# Patient Record
Sex: Female | Born: 1970
Health system: Southern US, Community
[De-identification: ages and names within clinical notes are randomized; demographics above are authoritative.]

## PROBLEM LIST (undated history)

## (undated) DIAGNOSIS — E119 Type 2 diabetes mellitus without complications: Secondary | ICD-10-CM

## (undated) DIAGNOSIS — J45909 Unspecified asthma, uncomplicated: Secondary | ICD-10-CM

## (undated) DIAGNOSIS — D649 Anemia, unspecified: Secondary | ICD-10-CM

## (undated) DIAGNOSIS — G4700 Insomnia, unspecified: Secondary | ICD-10-CM

## (undated) DIAGNOSIS — I341 Nonrheumatic mitral (valve) prolapse: Secondary | ICD-10-CM

## (undated) DIAGNOSIS — G500 Trigeminal neuralgia: Secondary | ICD-10-CM

## (undated) DIAGNOSIS — I10 Essential (primary) hypertension: Secondary | ICD-10-CM

## (undated) DIAGNOSIS — F419 Anxiety disorder, unspecified: Secondary | ICD-10-CM

## (undated) HISTORY — PX: TUBAL LIGATION: SHX77

## (undated) HISTORY — DX: Insomnia, unspecified: G47.00

## (undated) HISTORY — DX: Anxiety disorder, unspecified: F41.9

## (undated) HISTORY — PX: APPENDECTOMY: SHX54

## (undated) HISTORY — DX: Nonrheumatic mitral (valve) prolapse: I34.1

## (undated) HISTORY — DX: Anemia, unspecified: D64.9

## (undated) HISTORY — PX: TONSILLECTOMY: SUR1361

## (undated) HISTORY — PX: OTHER SURGICAL HISTORY: SHX169

## (undated) HISTORY — DX: Trigeminal neuralgia: G50.0

## (undated) HISTORY — PX: CHOLECYSTECTOMY: SHX55

---

## 2002-01-26 ENCOUNTER — Encounter: Payer: Self-pay | Admitting: Emergency Medicine

## 2002-01-26 ENCOUNTER — Emergency Department (HOSPITAL_COMMUNITY): Admission: EM | Admit: 2002-01-26 | Discharge: 2002-01-26 | Payer: Self-pay | Admitting: Emergency Medicine

## 2003-07-31 HISTORY — PX: CHOLECYSTECTOMY: SHX55

## 2013-07-16 ENCOUNTER — Encounter (HOSPITAL_COMMUNITY): Payer: Self-pay | Admitting: Emergency Medicine

## 2013-07-16 ENCOUNTER — Emergency Department (HOSPITAL_COMMUNITY)
Admission: EM | Admit: 2013-07-16 | Discharge: 2013-07-16 | Disposition: A | Discharge status: Home / Self Care | Payer: BC Managed Care – PPO | Attending: Emergency Medicine | Admitting: Emergency Medicine

## 2013-07-16 ENCOUNTER — Emergency Department (HOSPITAL_COMMUNITY): Payer: BC Managed Care – PPO

## 2013-07-16 DIAGNOSIS — J069 Acute upper respiratory infection, unspecified: Secondary | ICD-10-CM | POA: Insufficient documentation

## 2013-07-16 DIAGNOSIS — J45909 Unspecified asthma, uncomplicated: Secondary | ICD-10-CM | POA: Insufficient documentation

## 2013-07-16 DIAGNOSIS — I10 Essential (primary) hypertension: Secondary | ICD-10-CM | POA: Insufficient documentation

## 2013-07-16 DIAGNOSIS — E119 Type 2 diabetes mellitus without complications: Secondary | ICD-10-CM | POA: Insufficient documentation

## 2013-07-16 HISTORY — DX: Unspecified asthma, uncomplicated: J45.909

## 2013-07-16 HISTORY — DX: Essential (primary) hypertension: I10

## 2013-07-16 HISTORY — DX: Type 2 diabetes mellitus without complications: E11.9

## 2013-07-16 LAB — RAPID STREP SCREEN (MED CTR MEBANE ONLY): Streptococcus, Group A Screen (Direct): NEGATIVE

## 2013-07-16 MED ORDER — IBUPROFEN 800 MG PO TABS
800.0000 mg | ORAL_TABLET | Freq: Once | ORAL | Status: AC
  Administered 2013-07-16: 800 mg via ORAL
  Filled 2013-07-16: qty 1

## 2013-07-16 NOTE — ED Notes (Signed)
Sore throat, fever , onset this am. Daughter has a strep throat.

## 2013-07-16 NOTE — ED Provider Notes (Signed)
CSN: [id number]     Arrival date & time 07/16/13  1747 History   First MD Initiated Contact with Patient 07/16/13 1816     Chief Complaint  Patient presents with  . Sore Throat  . Cough  . Fever   HPI Pt was seen at 1840.  Per pt, c/o gradual onset and persistence of constant sore throat, runny/stuffy nose, sinus congestion, home fever/chills and cough since this morning.  States her daughter and son have the same symptoms; daughter was dx with "strep throat" and pt is concerned regarding same today. Denies rash, no CP/SOB, no N/V/D, no abd pain.     Past Medical History  Diagnosis Date  . Diabetes mellitus without complication   . Hypertension   . Asthma    Past Surgical History  Procedure Laterality Date  . Appendectomy    . Tonsillectomy    . Cholecystectomy    . Dilitation and curretage    . Tubal ligation      History  Substance Use Topics  . Smoking status: Never Smoker   . Smokeless tobacco: Not on file  . Alcohol Use: No    Review of Systems ROS: Statement: All systems negative except as marked or noted in the HPI; Constitutional: +fever and chills. ; ; Eyes: Negative for eye pain, redness and discharge. ; ; ENMT: Negative for ear pain, hoarseness, +nasal congestion, sinus pressure, rhinorrhea, and sore throat. ; ; Cardiovascular: Negative for chest pain, palpitations, diaphoresis, dyspnea and peripheral edema. ; ; Respiratory: +cough. Negative for wheezing and stridor. ; ; Gastrointestinal: Negative for nausea, vomiting, diarrhea, abdominal pain, blood in stool, hematemesis, jaundice and rectal bleeding. . ; ; Genitourinary: Negative for dysuria, flank pain and hematuria. ; ; Musculoskeletal: Negative for back pain and neck pain. Negative for swelling and trauma.; ; Skin: Negative for pruritus, rash, abrasions, blisters, bruising and skin lesion.; ; Neuro: Negative for headache, lightheadedness and neck stiffness. Negative for weakness, altered level of consciousness ,  altered mental status, extremity weakness, paresthesias, involuntary movement, seizure and syncope.       Allergies  Adhesive and Erythromycin  Home Medications  No current outpatient prescriptions on file. BP 123/89  Pulse 122  Temp(Src) 102.2 F (39 C) (Oral)  Resp 22  Ht 5' 8.5" (1.74 m)  Wt 275 lb (124.739 kg)  BMI 41.20 kg/m2  SpO2 100%  LMP 06/20/2013 Physical Exam 1845: Physical examination:  Nursing notes reviewed; Vital signs and O2 SAT reviewed;  Constitutional: Well developed, Well nourished, Well hydrated, In no acute distress; Head:  Normocephalic, atraumatic; Eyes: EOMI, PERRL, No scleral icterus; ENMT: TM's clear bilat. +edemetous nasal turbinates bilat with clear rhinorrhea. Mouth and pharynx without lesions. No tonsillar exudates. No intra-oral edema. No submandibular or sublingual edema. No hoarse voice, no drooling, no stridor. No pain with manipulation of larynx. Mouth and pharynx normal, Mucous membranes moist; Neck: Supple, Full range of motion, No lymphadenopathy; Cardiovascular: Regular rate and rhythm, No murmur, rub, or gallop; Respiratory: Breath sounds clear & equal bilaterally, No rales, rhonchi, wheezes.  Speaking full sentences with ease, Normal respiratory effort/excursion; Chest: Nontender, Movement normal; Abdomen: Soft, Nontender, Nondistended, Normal bowel sounds; Genitourinary: No CVA tenderness; Extremities: Pulses normal, No tenderness, No edema, No calf edema or asymmetry.; Neuro: AA&Ox3, Major CN grossly intact.  Speech clear. Climbs on and off stretcher easily by herself. Gait steady. No gross focal motor or sensory deficits in extremities.; Skin: Color normal, Warm, Dry.   ED Course  Procedures  EKG Interpretation   None       MDM  MDM Reviewed: previous chart, nursing note and vitals Interpretation: labs and x-ray   Results for orders placed during the hospital encounter of 07/16/13  RAPID STREP SCREEN      Result Value Range    Streptococcus, Group A Screen (Direct) NEGATIVE  NEGATIVE   Dg Chest 2 View 07/16/2013   CLINICAL DATA:  Cough and fever.  EXAM: CHEST  2 VIEW  COMPARISON:  01/26/2013  FINDINGS: The heart size and mediastinal contours are within normal limits. Both lungs are clear. The visualized skeletal structures are unremarkable.  IMPRESSION: No active cardiopulmonary disease.   Electronically Signed   By: Myles Rosenthal M.D.   On: 07/16/2013 19:12    1920:   Appears URI, will tx symptomatically for now. Pt wants to go home. States she already has PMD appt on Monday. Dx and testing d/w pt and family.  Questions answered.  Verb understanding, agreeable to d/c home with outpt f/u.      Laray Anger, DO 07/18/13 1130

## 2013-07-18 LAB — CULTURE, GROUP A STREP

## 2014-07-30 HISTORY — PX: GASTRIC BYPASS: SHX52

## 2015-01-21 IMAGING — CR DG CHEST 2V
2 series · 2 of 2 positions shown · non-contrast
Comparison: 01/26/2013

CLINICAL DATA: Cough and fever.

EXAM:
CHEST  2 VIEW

[view not recorded (1 of 2)]
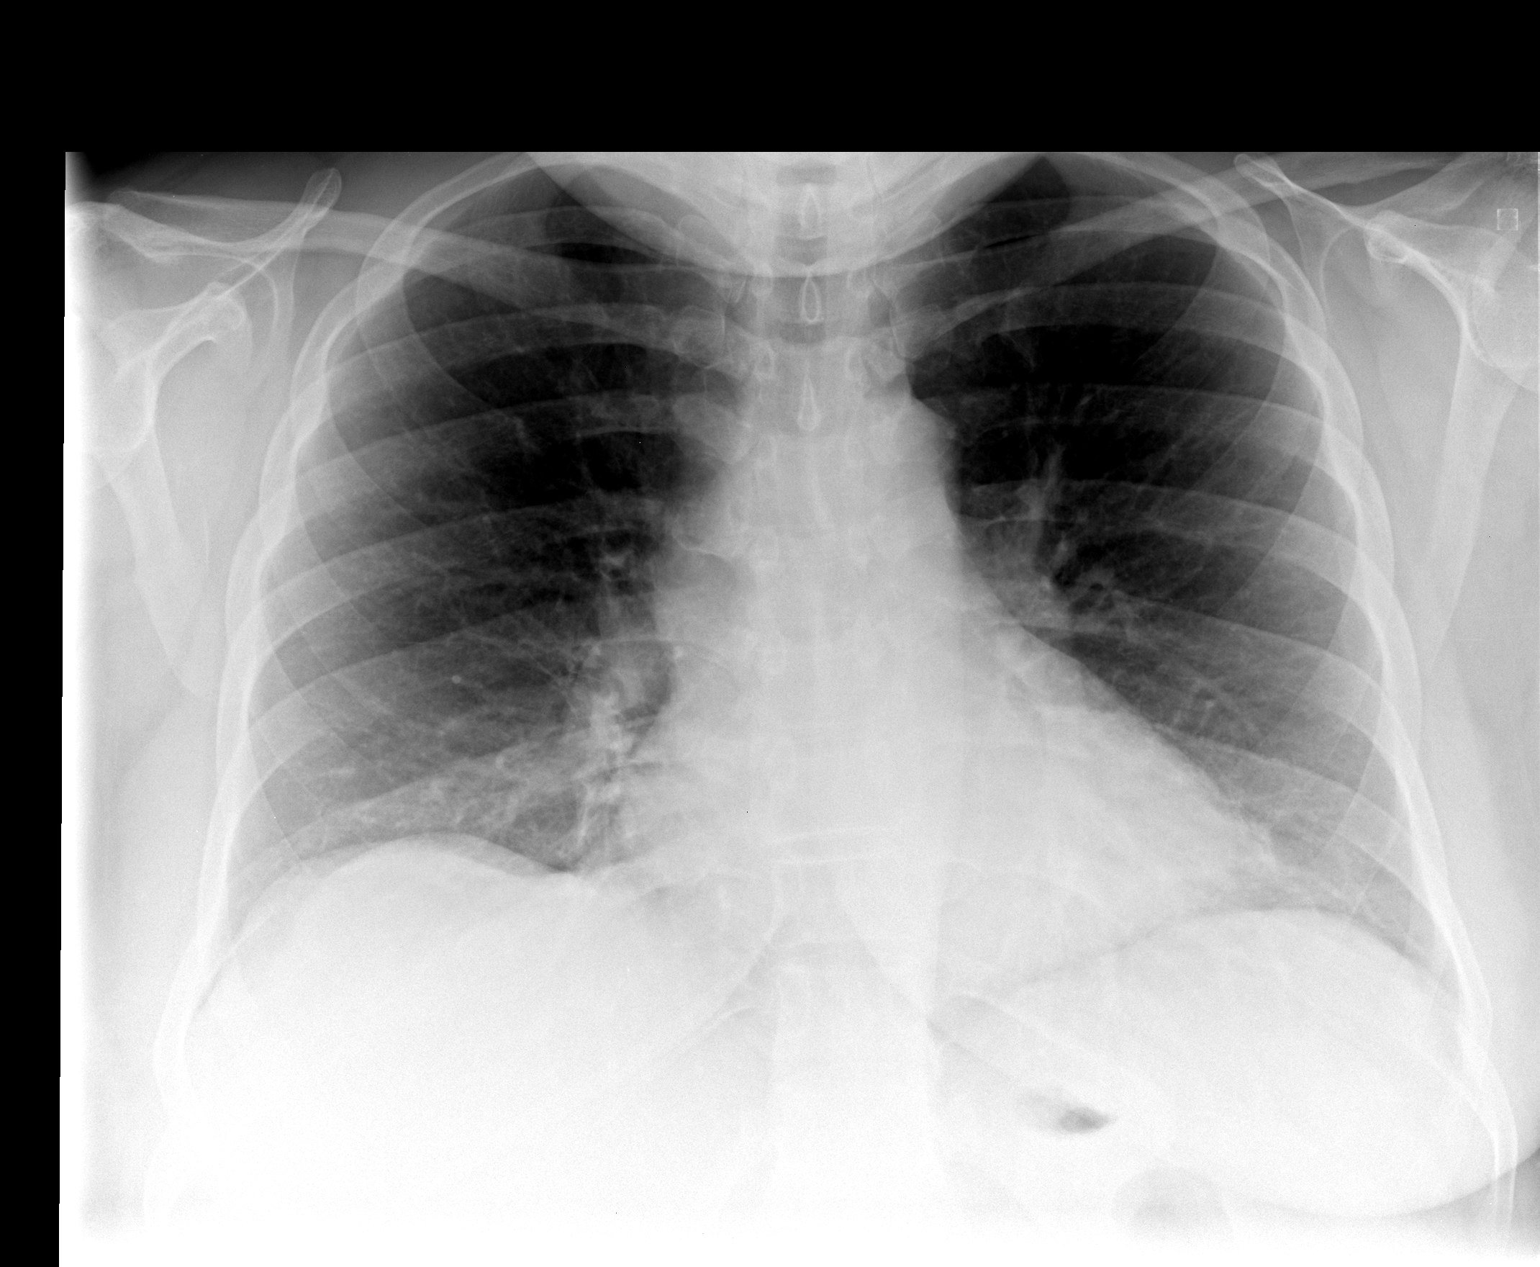

[view not recorded (2 of 2)]
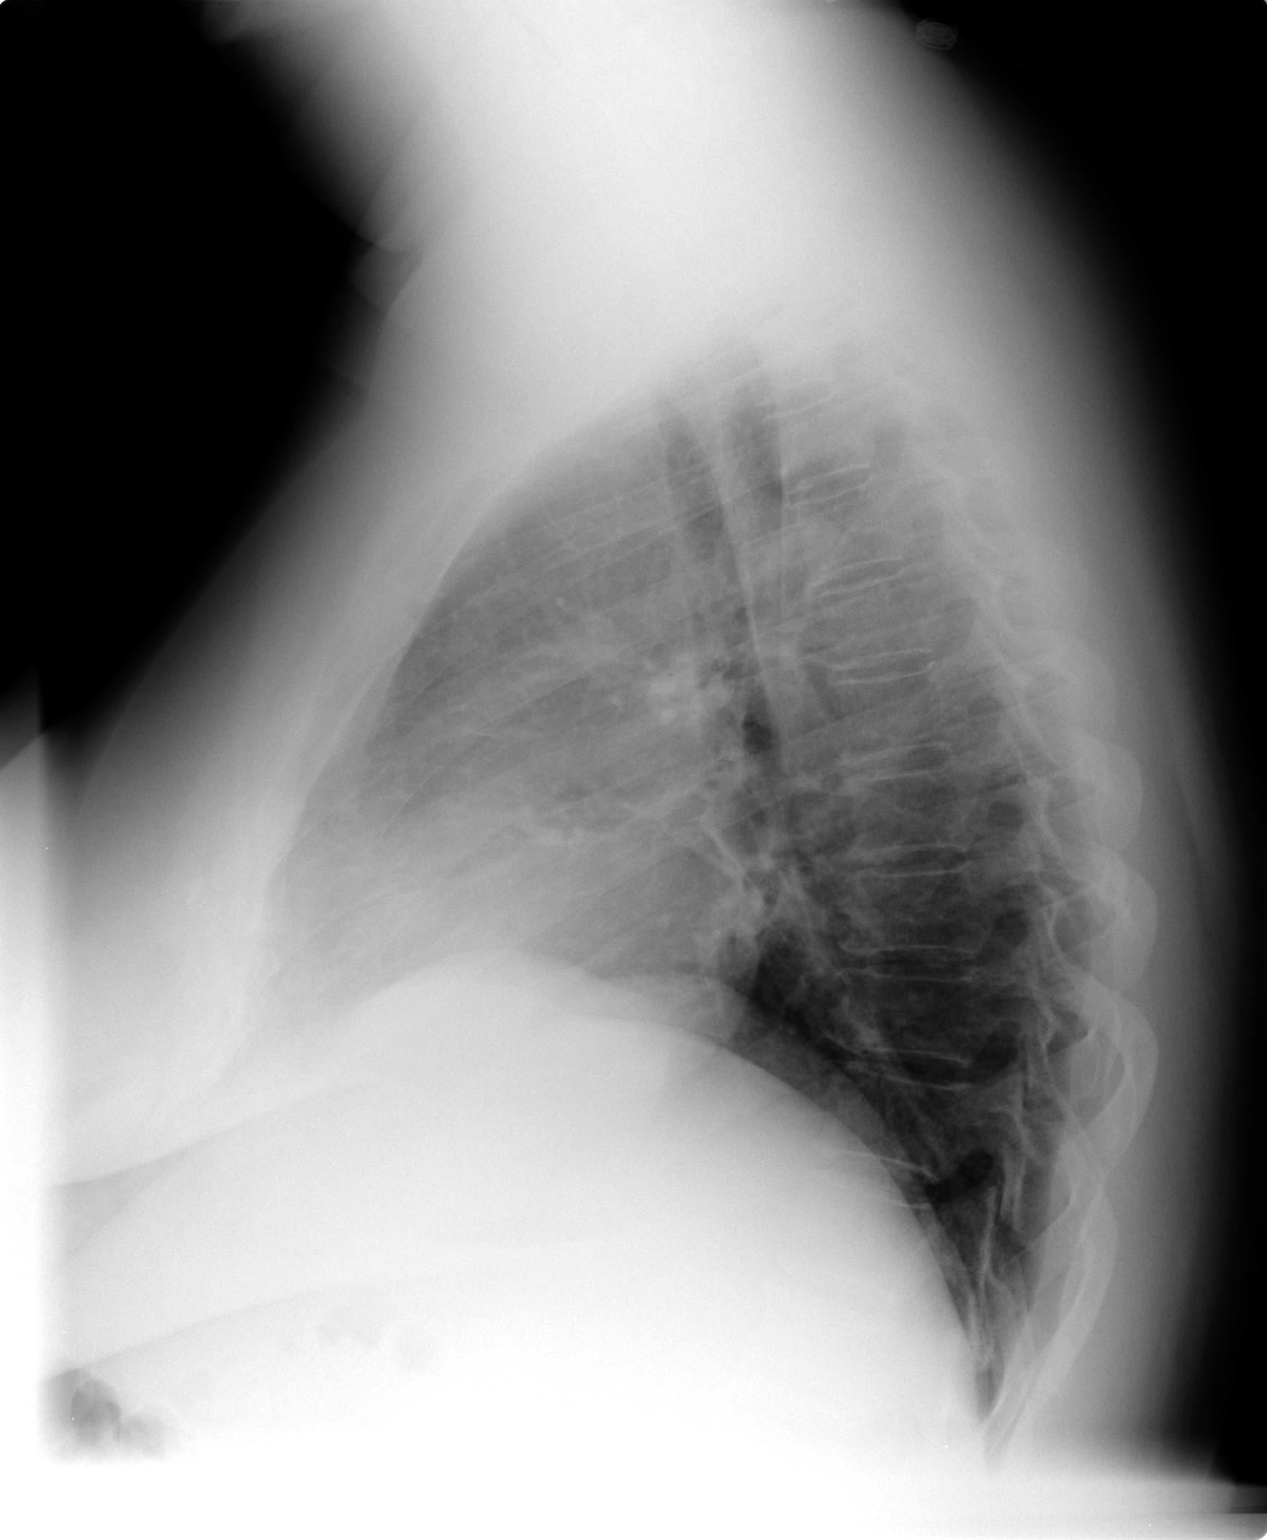

[2 of 2 positions shown; findings below may reference images not displayed]

FINDINGS: The heart size and mediastinal contours are within normal limits.
Both lungs are clear. The visualized skeletal structures are
unremarkable.
IMPRESSION: No active cardiopulmonary disease.

## 2015-02-28 HISTORY — PX: OTHER SURGICAL HISTORY: SHX169

## 2015-11-22 DIAGNOSIS — I8312 Varicose veins of left lower extremity with inflammation: Secondary | ICD-10-CM | POA: Diagnosis not present

## 2015-11-22 DIAGNOSIS — I8311 Varicose veins of right lower extremity with inflammation: Secondary | ICD-10-CM | POA: Diagnosis not present

## 2015-11-25 DIAGNOSIS — K210 Gastro-esophageal reflux disease with esophagitis: Secondary | ICD-10-CM | POA: Diagnosis not present

## 2015-11-25 DIAGNOSIS — I10 Essential (primary) hypertension: Secondary | ICD-10-CM | POA: Diagnosis not present

## 2015-11-25 DIAGNOSIS — Z6829 Body mass index (BMI) 29.0-29.9, adult: Secondary | ICD-10-CM | POA: Diagnosis not present

## 2015-11-25 DIAGNOSIS — E1165 Type 2 diabetes mellitus with hyperglycemia: Secondary | ICD-10-CM | POA: Diagnosis not present

## 2015-12-07 DIAGNOSIS — I83811 Varicose veins of right lower extremities with pain: Secondary | ICD-10-CM | POA: Diagnosis not present

## 2015-12-07 DIAGNOSIS — I8311 Varicose veins of right lower extremity with inflammation: Secondary | ICD-10-CM | POA: Diagnosis not present

## 2015-12-09 DIAGNOSIS — I8311 Varicose veins of right lower extremity with inflammation: Secondary | ICD-10-CM | POA: Diagnosis not present

## 2015-12-12 DIAGNOSIS — I8311 Varicose veins of right lower extremity with inflammation: Secondary | ICD-10-CM | POA: Diagnosis not present

## 2015-12-12 DIAGNOSIS — M79651 Pain in right thigh: Secondary | ICD-10-CM | POA: Diagnosis not present

## 2015-12-21 DIAGNOSIS — I83811 Varicose veins of right lower extremities with pain: Secondary | ICD-10-CM | POA: Diagnosis not present

## 2015-12-21 DIAGNOSIS — I87321 Chronic venous hypertension (idiopathic) with inflammation of right lower extremity: Secondary | ICD-10-CM | POA: Diagnosis not present

## 2015-12-21 DIAGNOSIS — I8311 Varicose veins of right lower extremity with inflammation: Secondary | ICD-10-CM | POA: Diagnosis not present

## 2016-01-04 DIAGNOSIS — I8311 Varicose veins of right lower extremity with inflammation: Secondary | ICD-10-CM | POA: Diagnosis not present

## 2016-01-04 DIAGNOSIS — I83811 Varicose veins of right lower extremities with pain: Secondary | ICD-10-CM | POA: Diagnosis not present

## 2016-01-04 DIAGNOSIS — M7981 Nontraumatic hematoma of soft tissue: Secondary | ICD-10-CM | POA: Diagnosis not present

## 2016-01-04 DIAGNOSIS — I87321 Chronic venous hypertension (idiopathic) with inflammation of right lower extremity: Secondary | ICD-10-CM | POA: Diagnosis not present

## 2016-01-24 DIAGNOSIS — I83811 Varicose veins of right lower extremities with pain: Secondary | ICD-10-CM | POA: Diagnosis not present

## 2016-01-24 DIAGNOSIS — I8311 Varicose veins of right lower extremity with inflammation: Secondary | ICD-10-CM | POA: Diagnosis not present

## 2016-01-24 DIAGNOSIS — M7981 Nontraumatic hematoma of soft tissue: Secondary | ICD-10-CM | POA: Diagnosis not present

## 2016-01-24 DIAGNOSIS — I87321 Chronic venous hypertension (idiopathic) with inflammation of right lower extremity: Secondary | ICD-10-CM | POA: Diagnosis not present

## 2016-03-26 DIAGNOSIS — K210 Gastro-esophageal reflux disease with esophagitis: Secondary | ICD-10-CM | POA: Diagnosis not present

## 2016-03-26 DIAGNOSIS — Z79899 Other long term (current) drug therapy: Secondary | ICD-10-CM | POA: Diagnosis not present

## 2016-03-26 DIAGNOSIS — I10 Essential (primary) hypertension: Secondary | ICD-10-CM | POA: Diagnosis not present

## 2016-03-26 DIAGNOSIS — E1165 Type 2 diabetes mellitus with hyperglycemia: Secondary | ICD-10-CM | POA: Diagnosis not present

## 2016-03-26 DIAGNOSIS — Z6830 Body mass index (BMI) 30.0-30.9, adult: Secondary | ICD-10-CM | POA: Diagnosis not present

## 2016-04-25 DIAGNOSIS — N390 Urinary tract infection, site not specified: Secondary | ICD-10-CM | POA: Diagnosis not present

## 2016-07-24 DIAGNOSIS — K210 Gastro-esophageal reflux disease with esophagitis: Secondary | ICD-10-CM | POA: Diagnosis not present

## 2016-07-24 DIAGNOSIS — J208 Acute bronchitis due to other specified organisms: Secondary | ICD-10-CM | POA: Diagnosis not present

## 2016-07-24 DIAGNOSIS — I10 Essential (primary) hypertension: Secondary | ICD-10-CM | POA: Diagnosis not present

## 2016-07-24 DIAGNOSIS — E1165 Type 2 diabetes mellitus with hyperglycemia: Secondary | ICD-10-CM | POA: Diagnosis not present

## 2016-10-25 DIAGNOSIS — E1165 Type 2 diabetes mellitus with hyperglycemia: Secondary | ICD-10-CM | POA: Diagnosis not present

## 2016-10-25 DIAGNOSIS — Z6831 Body mass index (BMI) 31.0-31.9, adult: Secondary | ICD-10-CM | POA: Diagnosis not present

## 2016-10-25 DIAGNOSIS — K210 Gastro-esophageal reflux disease with esophagitis: Secondary | ICD-10-CM | POA: Diagnosis not present

## 2016-10-25 DIAGNOSIS — I10 Essential (primary) hypertension: Secondary | ICD-10-CM | POA: Diagnosis not present

## 2017-01-10 DIAGNOSIS — Z1231 Encounter for screening mammogram for malignant neoplasm of breast: Secondary | ICD-10-CM | POA: Diagnosis not present

## 2017-02-21 DIAGNOSIS — E1165 Type 2 diabetes mellitus with hyperglycemia: Secondary | ICD-10-CM | POA: Diagnosis not present

## 2017-02-21 DIAGNOSIS — I10 Essential (primary) hypertension: Secondary | ICD-10-CM | POA: Diagnosis not present

## 2017-02-21 DIAGNOSIS — K210 Gastro-esophageal reflux disease with esophagitis: Secondary | ICD-10-CM | POA: Diagnosis not present

## 2017-02-21 DIAGNOSIS — Z6832 Body mass index (BMI) 32.0-32.9, adult: Secondary | ICD-10-CM | POA: Diagnosis not present

## 2017-03-01 DIAGNOSIS — M4696 Unspecified inflammatory spondylopathy, lumbar region: Secondary | ICD-10-CM | POA: Diagnosis not present

## 2017-03-01 DIAGNOSIS — Z87891 Personal history of nicotine dependence: Secondary | ICD-10-CM | POA: Diagnosis not present

## 2017-03-01 DIAGNOSIS — M47816 Spondylosis without myelopathy or radiculopathy, lumbar region: Secondary | ICD-10-CM | POA: Diagnosis not present

## 2017-03-01 DIAGNOSIS — I10 Essential (primary) hypertension: Secondary | ICD-10-CM | POA: Diagnosis not present

## 2017-03-01 DIAGNOSIS — M5136 Other intervertebral disc degeneration, lumbar region: Secondary | ICD-10-CM | POA: Diagnosis not present

## 2017-03-01 DIAGNOSIS — Z79899 Other long term (current) drug therapy: Secondary | ICD-10-CM | POA: Diagnosis not present

## 2017-03-01 DIAGNOSIS — E119 Type 2 diabetes mellitus without complications: Secondary | ICD-10-CM | POA: Diagnosis not present

## 2017-03-01 DIAGNOSIS — M25552 Pain in left hip: Secondary | ICD-10-CM | POA: Diagnosis not present

## 2017-05-28 DIAGNOSIS — I10 Essential (primary) hypertension: Secondary | ICD-10-CM | POA: Diagnosis not present

## 2017-05-28 DIAGNOSIS — K210 Gastro-esophageal reflux disease with esophagitis: Secondary | ICD-10-CM | POA: Diagnosis not present

## 2017-05-28 DIAGNOSIS — Z6832 Body mass index (BMI) 32.0-32.9, adult: Secondary | ICD-10-CM | POA: Diagnosis not present

## 2017-05-28 DIAGNOSIS — E119 Type 2 diabetes mellitus without complications: Secondary | ICD-10-CM | POA: Diagnosis not present

## 2017-06-04 DIAGNOSIS — Z6832 Body mass index (BMI) 32.0-32.9, adult: Secondary | ICD-10-CM | POA: Diagnosis not present

## 2017-06-04 DIAGNOSIS — M545 Low back pain: Secondary | ICD-10-CM | POA: Diagnosis not present

## 2017-09-23 DIAGNOSIS — N924 Excessive bleeding in the premenopausal period: Secondary | ICD-10-CM | POA: Diagnosis not present

## 2017-09-23 DIAGNOSIS — M545 Low back pain: Secondary | ICD-10-CM | POA: Diagnosis not present

## 2017-09-23 DIAGNOSIS — Z6832 Body mass index (BMI) 32.0-32.9, adult: Secondary | ICD-10-CM | POA: Diagnosis not present

## 2017-12-25 DIAGNOSIS — N924 Excessive bleeding in the premenopausal period: Secondary | ICD-10-CM | POA: Diagnosis not present

## 2017-12-25 DIAGNOSIS — Z6832 Body mass index (BMI) 32.0-32.9, adult: Secondary | ICD-10-CM | POA: Diagnosis not present

## 2017-12-25 DIAGNOSIS — Z0000 Encounter for general adult medical examination without abnormal findings: Secondary | ICD-10-CM | POA: Diagnosis not present

## 2017-12-25 DIAGNOSIS — Z6831 Body mass index (BMI) 31.0-31.9, adult: Secondary | ICD-10-CM | POA: Diagnosis not present

## 2017-12-25 DIAGNOSIS — M545 Low back pain: Secondary | ICD-10-CM | POA: Diagnosis not present

## 2017-12-30 DIAGNOSIS — M47816 Spondylosis without myelopathy or radiculopathy, lumbar region: Secondary | ICD-10-CM | POA: Diagnosis not present

## 2017-12-30 DIAGNOSIS — M545 Low back pain: Secondary | ICD-10-CM | POA: Diagnosis not present

## 2017-12-30 DIAGNOSIS — M4726 Other spondylosis with radiculopathy, lumbar region: Secondary | ICD-10-CM | POA: Diagnosis not present

## 2017-12-30 DIAGNOSIS — M47817 Spondylosis without myelopathy or radiculopathy, lumbosacral region: Secondary | ICD-10-CM | POA: Diagnosis not present

## 2018-01-13 DIAGNOSIS — Z1231 Encounter for screening mammogram for malignant neoplasm of breast: Secondary | ICD-10-CM | POA: Diagnosis not present

## 2018-02-17 DIAGNOSIS — Z01419 Encounter for gynecological examination (general) (routine) without abnormal findings: Secondary | ICD-10-CM | POA: Diagnosis not present

## 2018-03-04 DIAGNOSIS — R87619 Unspecified abnormal cytological findings in specimens from cervix uteri: Secondary | ICD-10-CM | POA: Diagnosis not present

## 2018-03-04 DIAGNOSIS — Z6831 Body mass index (BMI) 31.0-31.9, adult: Secondary | ICD-10-CM | POA: Diagnosis not present

## 2018-03-12 DIAGNOSIS — Z91013 Allergy to seafood: Secondary | ICD-10-CM | POA: Diagnosis not present

## 2018-03-12 DIAGNOSIS — I10 Essential (primary) hypertension: Secondary | ICD-10-CM | POA: Diagnosis not present

## 2018-03-12 DIAGNOSIS — Z91048 Other nonmedicinal substance allergy status: Secondary | ICD-10-CM | POA: Diagnosis not present

## 2018-03-12 DIAGNOSIS — N938 Other specified abnormal uterine and vaginal bleeding: Secondary | ICD-10-CM | POA: Diagnosis not present

## 2018-03-12 DIAGNOSIS — Z881 Allergy status to other antibiotic agents status: Secondary | ICD-10-CM | POA: Diagnosis not present

## 2018-03-12 DIAGNOSIS — N813 Complete uterovaginal prolapse: Secondary | ICD-10-CM | POA: Diagnosis not present

## 2018-03-12 DIAGNOSIS — Z9049 Acquired absence of other specified parts of digestive tract: Secondary | ICD-10-CM | POA: Diagnosis not present

## 2018-03-12 DIAGNOSIS — N920 Excessive and frequent menstruation with regular cycle: Secondary | ICD-10-CM | POA: Diagnosis not present

## 2018-03-12 DIAGNOSIS — J45909 Unspecified asthma, uncomplicated: Secondary | ICD-10-CM | POA: Diagnosis not present

## 2018-03-12 DIAGNOSIS — Z8249 Family history of ischemic heart disease and other diseases of the circulatory system: Secondary | ICD-10-CM | POA: Diagnosis not present

## 2018-03-12 DIAGNOSIS — D649 Anemia, unspecified: Secondary | ICD-10-CM | POA: Diagnosis not present

## 2018-03-12 DIAGNOSIS — Z9884 Bariatric surgery status: Secondary | ICD-10-CM | POA: Diagnosis not present

## 2018-03-12 DIAGNOSIS — N814 Uterovaginal prolapse, unspecified: Secondary | ICD-10-CM | POA: Diagnosis not present

## 2018-03-12 DIAGNOSIS — Z79899 Other long term (current) drug therapy: Secondary | ICD-10-CM | POA: Diagnosis not present

## 2018-03-12 DIAGNOSIS — Z801 Family history of malignant neoplasm of trachea, bronchus and lung: Secondary | ICD-10-CM | POA: Diagnosis not present

## 2018-03-12 DIAGNOSIS — Z6831 Body mass index (BMI) 31.0-31.9, adult: Secondary | ICD-10-CM | POA: Diagnosis not present

## 2018-03-12 DIAGNOSIS — E119 Type 2 diabetes mellitus without complications: Secondary | ICD-10-CM | POA: Diagnosis not present

## 2018-03-12 DIAGNOSIS — Z836 Family history of other diseases of the respiratory system: Secondary | ICD-10-CM | POA: Diagnosis not present

## 2018-03-13 DIAGNOSIS — N939 Abnormal uterine and vaginal bleeding, unspecified: Secondary | ICD-10-CM | POA: Diagnosis not present

## 2018-03-13 DIAGNOSIS — Z9884 Bariatric surgery status: Secondary | ICD-10-CM | POA: Diagnosis not present

## 2018-03-13 DIAGNOSIS — Z836 Family history of other diseases of the respiratory system: Secondary | ICD-10-CM | POA: Diagnosis not present

## 2018-03-13 DIAGNOSIS — N938 Other specified abnormal uterine and vaginal bleeding: Secondary | ICD-10-CM | POA: Diagnosis not present

## 2018-03-13 DIAGNOSIS — E119 Type 2 diabetes mellitus without complications: Secondary | ICD-10-CM | POA: Diagnosis not present

## 2018-03-13 DIAGNOSIS — Z881 Allergy status to other antibiotic agents status: Secondary | ICD-10-CM | POA: Diagnosis not present

## 2018-03-13 DIAGNOSIS — D649 Anemia, unspecified: Secondary | ICD-10-CM | POA: Diagnosis not present

## 2018-03-13 DIAGNOSIS — Z801 Family history of malignant neoplasm of trachea, bronchus and lung: Secondary | ICD-10-CM | POA: Diagnosis not present

## 2018-03-13 DIAGNOSIS — Z79899 Other long term (current) drug therapy: Secondary | ICD-10-CM | POA: Diagnosis not present

## 2018-03-13 DIAGNOSIS — J45909 Unspecified asthma, uncomplicated: Secondary | ICD-10-CM | POA: Diagnosis not present

## 2018-03-13 DIAGNOSIS — N72 Inflammatory disease of cervix uteri: Secondary | ICD-10-CM | POA: Diagnosis not present

## 2018-03-13 DIAGNOSIS — Z9049 Acquired absence of other specified parts of digestive tract: Secondary | ICD-10-CM | POA: Diagnosis not present

## 2018-03-13 DIAGNOSIS — N813 Complete uterovaginal prolapse: Secondary | ICD-10-CM | POA: Diagnosis not present

## 2018-03-13 DIAGNOSIS — E669 Obesity, unspecified: Secondary | ICD-10-CM | POA: Diagnosis not present

## 2018-03-13 DIAGNOSIS — Z91013 Allergy to seafood: Secondary | ICD-10-CM | POA: Diagnosis not present

## 2018-03-13 DIAGNOSIS — I10 Essential (primary) hypertension: Secondary | ICD-10-CM | POA: Diagnosis not present

## 2018-03-13 DIAGNOSIS — Z8249 Family history of ischemic heart disease and other diseases of the circulatory system: Secondary | ICD-10-CM | POA: Diagnosis not present

## 2018-03-13 DIAGNOSIS — Z91048 Other nonmedicinal substance allergy status: Secondary | ICD-10-CM | POA: Diagnosis not present

## 2018-03-13 DIAGNOSIS — N920 Excessive and frequent menstruation with regular cycle: Secondary | ICD-10-CM | POA: Diagnosis not present

## 2018-03-14 DIAGNOSIS — Z836 Family history of other diseases of the respiratory system: Secondary | ICD-10-CM | POA: Diagnosis not present

## 2018-03-14 DIAGNOSIS — N938 Other specified abnormal uterine and vaginal bleeding: Secondary | ICD-10-CM | POA: Diagnosis not present

## 2018-03-14 DIAGNOSIS — Z91048 Other nonmedicinal substance allergy status: Secondary | ICD-10-CM | POA: Diagnosis not present

## 2018-03-14 DIAGNOSIS — E119 Type 2 diabetes mellitus without complications: Secondary | ICD-10-CM | POA: Diagnosis not present

## 2018-03-14 DIAGNOSIS — Z91013 Allergy to seafood: Secondary | ICD-10-CM | POA: Diagnosis not present

## 2018-03-14 DIAGNOSIS — D649 Anemia, unspecified: Secondary | ICD-10-CM | POA: Diagnosis not present

## 2018-03-14 DIAGNOSIS — Z9884 Bariatric surgery status: Secondary | ICD-10-CM | POA: Diagnosis not present

## 2018-03-14 DIAGNOSIS — N920 Excessive and frequent menstruation with regular cycle: Secondary | ICD-10-CM | POA: Diagnosis not present

## 2018-03-14 DIAGNOSIS — Z79899 Other long term (current) drug therapy: Secondary | ICD-10-CM | POA: Diagnosis not present

## 2018-03-14 DIAGNOSIS — N813 Complete uterovaginal prolapse: Secondary | ICD-10-CM | POA: Diagnosis not present

## 2018-03-14 DIAGNOSIS — J45909 Unspecified asthma, uncomplicated: Secondary | ICD-10-CM | POA: Diagnosis not present

## 2018-03-14 DIAGNOSIS — Z881 Allergy status to other antibiotic agents status: Secondary | ICD-10-CM | POA: Diagnosis not present

## 2018-03-14 DIAGNOSIS — Z8249 Family history of ischemic heart disease and other diseases of the circulatory system: Secondary | ICD-10-CM | POA: Diagnosis not present

## 2018-03-14 DIAGNOSIS — Z801 Family history of malignant neoplasm of trachea, bronchus and lung: Secondary | ICD-10-CM | POA: Diagnosis not present

## 2018-03-14 DIAGNOSIS — I10 Essential (primary) hypertension: Secondary | ICD-10-CM | POA: Diagnosis not present

## 2018-03-14 DIAGNOSIS — Z9049 Acquired absence of other specified parts of digestive tract: Secondary | ICD-10-CM | POA: Diagnosis not present

## 2018-03-27 DIAGNOSIS — J4520 Mild intermittent asthma, uncomplicated: Secondary | ICD-10-CM | POA: Diagnosis not present

## 2018-03-27 DIAGNOSIS — F3289 Other specified depressive episodes: Secondary | ICD-10-CM | POA: Diagnosis not present

## 2018-03-27 DIAGNOSIS — Z6831 Body mass index (BMI) 31.0-31.9, adult: Secondary | ICD-10-CM | POA: Diagnosis not present

## 2018-03-27 DIAGNOSIS — I10 Essential (primary) hypertension: Secondary | ICD-10-CM | POA: Diagnosis not present

## 2018-04-09 DIAGNOSIS — Z6831 Body mass index (BMI) 31.0-31.9, adult: Secondary | ICD-10-CM | POA: Diagnosis not present

## 2018-04-09 DIAGNOSIS — J018 Other acute sinusitis: Secondary | ICD-10-CM | POA: Diagnosis not present

## 2018-07-28 DIAGNOSIS — Z6832 Body mass index (BMI) 32.0-32.9, adult: Secondary | ICD-10-CM | POA: Diagnosis not present

## 2018-07-28 DIAGNOSIS — I10 Essential (primary) hypertension: Secondary | ICD-10-CM | POA: Diagnosis not present

## 2018-07-28 DIAGNOSIS — M545 Low back pain: Secondary | ICD-10-CM | POA: Diagnosis not present

## 2018-10-27 DIAGNOSIS — I10 Essential (primary) hypertension: Secondary | ICD-10-CM | POA: Diagnosis not present

## 2018-10-27 DIAGNOSIS — Z6832 Body mass index (BMI) 32.0-32.9, adult: Secondary | ICD-10-CM | POA: Diagnosis not present

## 2018-10-27 DIAGNOSIS — Z79899 Other long term (current) drug therapy: Secondary | ICD-10-CM | POA: Diagnosis not present

## 2018-10-27 DIAGNOSIS — M545 Low back pain: Secondary | ICD-10-CM | POA: Diagnosis not present

## 2018-10-27 DIAGNOSIS — R5383 Other fatigue: Secondary | ICD-10-CM | POA: Diagnosis not present

## 2019-03-17 DIAGNOSIS — Z6833 Body mass index (BMI) 33.0-33.9, adult: Secondary | ICD-10-CM | POA: Diagnosis not present

## 2019-03-17 DIAGNOSIS — Z0000 Encounter for general adult medical examination without abnormal findings: Secondary | ICD-10-CM | POA: Diagnosis not present

## 2019-04-02 ENCOUNTER — Ambulatory Visit: Payer: Self-pay

## 2019-04-02 ENCOUNTER — Other Ambulatory Visit: Payer: Self-pay

## 2019-04-02 ENCOUNTER — Ambulatory Visit (INDEPENDENT_AMBULATORY_CARE_PROVIDER_SITE_OTHER): Payer: BC Managed Care – PPO | Admitting: Orthopaedic Surgery

## 2019-04-02 VITALS — BP 130/86 | HR 76 | Ht 68.0 in | Wt 215.0 lb

## 2019-04-02 DIAGNOSIS — M25562 Pain in left knee: Secondary | ICD-10-CM | POA: Diagnosis not present

## 2019-04-02 DIAGNOSIS — M659 Synovitis and tenosynovitis, unspecified: Secondary | ICD-10-CM | POA: Diagnosis not present

## 2019-04-02 NOTE — Progress Notes (Signed)
Office Visit Note   Patient: Karina Prince           Date of Birth: 09-04-1970           MRN: [id number] Visit Date: 04/02/2019              Requested by: Toma DeitersHasanaj, Xaje A, MD [address] [place],  [place] [id number] PCP: Toma DeitersHasanaj, Xaje A, MD   Assessment & Plan: Visit Diagnoses:  1. Left knee pain, unspecified chronicity     Plan: Left knee injection performed which she tolerated well.  We will see how she does with this and recheck her in a couple weeks.  If she is having persistent symptoms will consider diagnostic MRI imaging of her left knee.  Follow-Up Instructions: No follow-ups on file.   Orders:  Orders Placed This Encounter  Procedures  . XR Knee 1-2 Views Left   No orders of the defined types were placed in this encounter.     Procedures: Large Joint Inj: L knee on 04/02/2019 3:34 PM Indications: joint swelling and pain Details: 22 G 1.5 in needle, anterolateral approach  Arthrogram: No  Medications: 0.5 mL lidocaine 1 %; 3 mL bupivacaine 0.5 %; 40 mg methylPREDNISolone acetate 40 MG/ML Outcome: tolerated well, no immediate complications Procedure, treatment alternatives, risks and benefits explained, specific risks discussed. Consent was given by the patient. Immediately prior to procedure a time out was called to verify the correct patient, procedure, equipment, support staff and site/side marked as required. Patient was prepped and draped in the usual sterile fashion.       Clinical Data: No additional findings.   Subjective: Chief Complaint  Patient presents with  . Left Knee - Pain    HPI 48 year old female with pain in her left knee since July when she fell in late July.  She states she has had difficulty putting any pressure on her left knee and that it locks up and gives way.  She had a steroid Dosepak which did seem to help temporarily.  No history of gout or previous knee fracture.  She denies chills or fever. Review of Systems positive  for previous tonsillectomy appendectomy gallbladder surgery D&C x5.  Gastric bypass 2016 she has been careful to anti-inflammatories.  Possible acid reflux anxiety depression diet-controlled diabetes hypertension superficial blood clot problems in her leg bronchitis asthma trigeminal neuralgia otherwise negative as it pertains HPI.   Objective: Vital Signs: BP 130/86   Pulse 76   Ht 5\' 8"  (1.727 m)   Wt 215 lb (97.5 kg)   BMI 32.69 kg/m   Physical Exam Constitutional:      Appearance: She is well-developed.  HENT:     Head: Normocephalic.     Right Ear: External ear normal.     Left Ear: External ear normal.  Eyes:     Pupils: Pupils are equal, round, and reactive to light.  Neck:     Thyroid: No thyromegaly.     Trachea: No tracheal deviation.  Cardiovascular:     Rate and Rhythm: Normal rate.  Pulmonary:     Effort: Pulmonary effort is normal.  Abdominal:     Palpations: Abdomen is soft.  Skin:    General: Skin is warm and dry.  Neurological:     Mental Status: She is alert and oriented to person, place, and time.  Psychiatric:        Behavior: Behavior normal.     Ortho Exam patient has negative logroll to  her hips.  She has tenderness on the left knee medially pain with patellofemoral compression normal patellar tracking collateral ligaments are stable.  Tenderness along the Pez bursa but not as severe along the joint line.  Specialty Comments:  No specialty comments available.  Imaging: No results found.   PMFS History: There are no active problems to display for this patient.  Past Medical History:  Diagnosis Date  . Asthma   . Diabetes mellitus without complication   . Hypertension     No family history on file.  Past Surgical History:  Procedure Laterality Date  . APPENDECTOMY    . CHOLECYSTECTOMY    . dilitation and curretage    . TONSILLECTOMY    . TUBAL LIGATION     Social History   Occupational History  . Not on file  Tobacco Use  .  Smoking status: Never Smoker  Substance and Sexual Activity  . Alcohol use: No  . Drug use: No  . Sexual activity: Yes    Birth control/protection: Surgical

## 2019-04-07 ENCOUNTER — Encounter: Payer: Self-pay | Admitting: Orthopaedic Surgery

## 2019-04-07 MED ORDER — LIDOCAINE HCL 1 % IJ SOLN
0.5000 mL | INTRAMUSCULAR | Status: AC | PRN
  Administered 2019-04-02: .5 mL

## 2019-04-07 MED ORDER — BUPIVACAINE HCL 0.5 % IJ SOLN
3.0000 mL | INTRAMUSCULAR | Status: AC | PRN
  Administered 2019-04-02: 3 mL via INTRA_ARTICULAR

## 2019-04-07 MED ORDER — METHYLPREDNISOLONE ACETATE 40 MG/ML IJ SUSP
40.0000 mg | INTRAMUSCULAR | Status: AC | PRN
  Administered 2019-04-02: 40 mg via INTRA_ARTICULAR

## 2019-04-27 DIAGNOSIS — Z1231 Encounter for screening mammogram for malignant neoplasm of breast: Secondary | ICD-10-CM | POA: Diagnosis not present

## 2019-06-04 ENCOUNTER — Encounter: Payer: Self-pay | Admitting: Orthopaedic Surgery

## 2019-06-04 ENCOUNTER — Other Ambulatory Visit: Payer: Self-pay

## 2019-06-04 ENCOUNTER — Ambulatory Visit (INDEPENDENT_AMBULATORY_CARE_PROVIDER_SITE_OTHER): Payer: BC Managed Care – PPO | Admitting: Orthopaedic Surgery

## 2019-06-04 VITALS — BP 122/86 | HR 82 | Ht 68.0 in | Wt 215.0 lb

## 2019-06-04 DIAGNOSIS — M25562 Pain in left knee: Secondary | ICD-10-CM

## 2019-06-04 NOTE — Progress Notes (Signed)
Office Visit Note   Patient: Karina Prince           Date of Birth: 1971/03/18           MRN: [id number] Visit Date: 06/04/2019              Requested by: Toma Deiters, MD [address] [place],  [place] [address] PCP: Toma Deiters, MD   Assessment & Plan: Visit Diagnoses:  1. Left knee pain, unspecified chronicity     Plan: Patient has persistent knee symptoms for greater than 3 months now failed treatment with prednisone Dosepak, intra-articular injection, anti-inflammatories, home therapy exercises, topical cream, Tylenol.  X-rays demonstrate some joint line narrowing and mild osteoarthritic changes without acute changes.  Her pain is medial joint line and occasionally sharp.  Will obtain an MRI scan to rule out medial meniscal tear or chondral flap tear office follow-up after scan for review.  Follow-Up Instructions: No follow-ups on file.   Orders:  Orders Placed This Encounter  Procedures  . MR Knee Left w/o contrast   No orders of the defined types were placed in this encounter.     Procedures: No procedures performed   Clinical Data: No additional findings.   Subjective: Chief Complaint  Patient presents with  . Left Knee - Pain, Follow-up    HPI four 48-year-old female returns with 7-month history of left knee pain which is been persistent primarily along the medial side.  She has difficulty being in 1 position and aches more at the end of the day she has problems crossing her leg it wakes her up at night at times she is used Tylenol and has had an intra-articular injection without relief.  She denies locking she is not fallen.  She is also been treated with steroid Dosepak which may have helped for a day or 2.  Previous gastric bypass.  Diet-controlled diabetes, hypertension, history of superficial DVT.  Review of Systems 14 point systems unchanged from 04/02/2019 other than as mentioned in HPI   Objective: Vital Signs: BP 122/86   Pulse 82   Ht 5'  8" (1.727 m)   Wt 215 lb (97.5 kg)   BMI 32.69 kg/m   Physical Exam Constitutional:      Appearance: She is well-developed.  HENT:     Head: Normocephalic.     Right Ear: External ear normal.     Left Ear: External ear normal.  Eyes:     Pupils: Pupils are equal, round, and reactive to light.  Neck:     Thyroid: No thyromegaly.     Trachea: No tracheal deviation.  Cardiovascular:     Rate and Rhythm: Normal rate.  Pulmonary:     Effort: Pulmonary effort is normal.  Abdominal:     Palpations: Abdomen is soft.  Skin:    General: Skin is warm and dry.  Neurological:     Mental Status: She is alert and oriented to person, place, and time.  Psychiatric:        Behavior: Behavior normal.     Ortho Exam patient has some tenderness of the left greater trochanter.  Knee reaches full extension pain with hyperextension.  She has tenderness over the pes bursa as well as medial joint line.  No palpable Baker's cyst.  Lachman test anterior drawer pivot shift test are negative.  No posterior instability.  No lateral joint line tenderness.  Medial joint line tenderness is persistent with and without distraction.  Normal hip range of motion both right and left but some discomfort with hip range of motion on the left with tenderness over the greater trochanter left side.  Negative straight leg raising 90 degrees.  Knee and ankle jerk are intact.  Specialty Comments:  No specialty comments available.  Imaging: No results found.   PMFS History: There are no active problems to display for this patient.  Past Medical History:  Diagnosis Date  . Asthma   . Diabetes mellitus without complication ([place])   . Hypertension     No family history on file.  Past Surgical History:  Procedure Laterality Date  . APPENDECTOMY    . CHOLECYSTECTOMY    . dilitation and curretage    . TONSILLECTOMY    . TUBAL LIGATION     Social History   Occupational History  . Not on file  Tobacco Use  .  Smoking status: Never Smoker  . Smokeless tobacco: Never Used  Substance and Sexual Activity  . Alcohol use: No  . Drug use: No  . Sexual activity: Yes    Birth control/protection: Surgical

## 2019-06-09 DIAGNOSIS — M25462 Effusion, left knee: Secondary | ICD-10-CM | POA: Diagnosis not present

## 2019-06-09 DIAGNOSIS — M25562 Pain in left knee: Secondary | ICD-10-CM | POA: Diagnosis not present

## 2019-06-11 ENCOUNTER — Encounter: Payer: Self-pay | Admitting: Orthopaedic Surgery

## 2019-06-11 ENCOUNTER — Ambulatory Visit (INDEPENDENT_AMBULATORY_CARE_PROVIDER_SITE_OTHER): Payer: BC Managed Care – PPO | Admitting: Orthopaedic Surgery

## 2019-06-11 DIAGNOSIS — M2242 Chondromalacia patellae, left knee: Secondary | ICD-10-CM | POA: Diagnosis not present

## 2019-06-11 DIAGNOSIS — M94261 Chondromalacia, right knee: Secondary | ICD-10-CM

## 2019-06-11 NOTE — Progress Notes (Signed)
Office Visit Note   Patient: Karina Prince           Date of Birth: 09-14-1970           MRN: [id number] Visit Date: 06/11/2019              Requested by: Neale Burly, MD [place],  [place] [address] PCP: Neale Burly, MD   Assessment & Plan: Visit Diagnoses: left knee chondromalacia   Plan: MRI was reviewed with patient I gave her a copy of the report.  She has tricompartmental chondral wear without exposed bone.  Menisci are intact.  Proximal portion of the PCL show some degeneration but no definite tear.  We discussed recommendations for conservative treatment continue to ice anti-inflammatories gradual weight loss using exercise bike.  Avoiding activities with significant increase patellofemoral loading.  We recommended continue using Voltaren gel intermittently and office follow-up if she has increased symptoms.  Follow-Up Instructions: Return if symptoms worsen or fail to improve.   Orders:  No orders of the defined types were placed in this encounter.  No orders of the defined types were placed in this encounter.     Procedures: No procedures performed   Clinical Data: No additional findings.   Subjective: Chief Complaint  Patient presents with  . Left Knee - Follow-up    MRI Review    HPI patient returns with ongoing problems with her knee.  MRI scan has been obtained of her left knee and is available for review.  She continues to have problems when she kneels.  Pain if she has been sitting with her knee in a flexed position and then tries to get up and walk.  No true locking mild swelling.  She is used Voltaren gel oral anti-inflammatories.  Review of Systems reviewed updated unchanged.   Objective: Vital Signs: Ht 5\' 8"  (1.727 m)   Wt 215 lb (97.5 kg)   BMI 32.69 kg/m   Physical Exam Constitutional:      Appearance: She is well-developed.  HENT:     Head: Normocephalic.     Right Ear: External ear normal.     Left Ear:  External ear normal.  Eyes:     Pupils: Pupils are equal, round, and reactive to light.  Neck:     Thyroid: No thyromegaly.     Trachea: No tracheal deviation.  Cardiovascular:     Rate and Rhythm: Normal rate.  Pulmonary:     Effort: Pulmonary effort is normal.  Abdominal:     Palpations: Abdomen is soft.  Skin:    General: Skin is warm and dry.  Neurological:     Mental Status: She is alert and oriented to person, place, and time.  Psychiatric:        Behavior: Behavior normal.     Ortho Exam patient has mild crepitus with knee range of motion no swelling.  Flexion past 120.  Knee comes into full extension.  Mild discomfort with patellofemoral loading.  Specialty Comments:  No specialty comments available.  Imaging: No results found.   PMFS History: Patient Active Problem List   Diagnosis Date Noted  . Chondromalacia patellae, left knee 06/11/2019   Past Medical History:  Diagnosis Date  . Asthma   . Diabetes mellitus without complication ([place])   . Hypertension     No family history on file.  Past Surgical History:  Procedure Laterality Date  . APPENDECTOMY    . CHOLECYSTECTOMY    .  dilitation and curretage    . TONSILLECTOMY    . TUBAL LIGATION     Social History   Occupational History  . Not on file  Tobacco Use  . Smoking status: Never Smoker  . Smokeless tobacco: Never Used  Substance and Sexual Activity  . Alcohol use: No  . Drug use: No  . Sexual activity: Yes    Birth control/protection: Surgical

## 2019-06-22 DIAGNOSIS — Z6832 Body mass index (BMI) 32.0-32.9, adult: Secondary | ICD-10-CM | POA: Diagnosis not present

## 2019-06-22 DIAGNOSIS — F3342 Major depressive disorder, recurrent, in full remission: Secondary | ICD-10-CM | POA: Diagnosis not present

## 2019-06-22 DIAGNOSIS — I10 Essential (primary) hypertension: Secondary | ICD-10-CM | POA: Diagnosis not present

## 2019-06-22 DIAGNOSIS — G588 Other specified mononeuropathies: Secondary | ICD-10-CM | POA: Diagnosis not present

## 2020-09-26 ENCOUNTER — Encounter: Payer: Self-pay | Admitting: Internal Medicine

## 2020-10-28 ENCOUNTER — Ambulatory Visit: Payer: BC Managed Care – PPO | Admitting: Gastroenterology

## 2020-11-05 NOTE — Progress Notes (Addendum)
Referring Provider:  Toma Deiters, MD  Primary Care Physician:  Toma Deiters, MD Primary Gastroenterologist:  Dr. Jena Gauss  Chief Complaint  Patient presents with  . Consult    TCS never done prior. Mother hx polyps, MGM hx colon cancer    HPI:   Karina Prince is a 50 y.o. female presenting today at the request of Hasanaj, Myra Gianotti, MD for consult colonoscopy.  Recommended office visit due to medications.  Today she reports she is doing well overall. No prior colonoscopy.     Reports bowels fluctuate from constipation to diarrhea. Can skip 3-4 days between BMs. When having diarrhea, she can have 5-6 BMs per day. This seems to be more predominant. Abdominal cramping before BMs that resolves thereafter.  No abdominal pain between bowel movements. No brbpr or melena.  Constipation twice a month. No nocturnal stools.  The symptoms have been present for years.   History of gastric bypass in 2016 and cholecystectomy in 2005.   Chronic history of anemia.  Reports she had trouble with anemia even prior to gastric bypass.  She takes oral iron daily.  Reports her hemoglobin can be as low as 7, but if it is good, it is in the 11 range.  Chronically on iron. Chronic fatigue/low energy.   EGD years ago prior to gastric bypass.  Cannot member exactly where this was.  Was told she had reflux. Can't remember if she had ulcerations.   GERD symptoms if eating sauce late at night. Nothing routine. No nausea or vomiting. No dysphagia.    No unintentional weight loss.   Denies NSAIDs.  Reports having labs completed with PCP a month or so ago.  Was told everything looked good. Request to hold off on colonoscopy for a month or so. She is currently fasting. Also had grandchildren being born in May and June.   Past Medical History:  Diagnosis Date  . Anemia   . Anxiety   . Asthma   . Diabetes mellitus without complication (HCC)   . Hypertension   . Insomnia   . MVP (mitral valve prolapse)   .  Trigeminal neuralgia     Past Surgical History:  Procedure Laterality Date  . APPENDECTOMY    . CHOLECYSTECTOMY  2005  . dilitation and curretage    . GASTRIC BYPASS  07/2014  . TONSILLECTOMY    . TUBAL LIGATION    . VENTRAL HERNIA REPAIR  02/2015    Current Outpatient Medications  Medication Sig Dispense Refill  . BIOTIN PO Take by mouth daily.    . bisoprolol (ZEBETA) 10 MG tablet Take 10 mg by mouth daily.    Marland Kitchen CALCIUM PO Take by mouth daily.    . clonazePAM (KLONOPIN) 1 MG tablet Take 1 mg by mouth daily.    . Cyanocobalamin (B-12 PO) Take by mouth daily.    . ferrous sulfate 325 (65 FE) MG EC tablet Take 325 mg by mouth daily.    Marland Kitchen gabapentin (NEURONTIN) 300 MG capsule Take 300 mg by mouth at bedtime.    . Glucosamine-Chondroitin (MOVE FREE PO) Take by mouth daily.    . Methocarbamol (ROBAXIN PO) Take by mouth daily.    . Multiple Vitamin (MULTIVITAMIN) tablet Take 1 tablet by mouth daily.    . traZODone (DESYREL) 100 MG tablet Take 100 mg by mouth at bedtime.    Marland Kitchen venlafaxine (EFFEXOR) 75 MG tablet Take 75 mg by mouth daily.     No current  facility-administered medications for this visit.    Allergies as of 11/07/2020 - Review Complete 11/07/2020  Allergen Reaction Noted  . Adhesive [tape]  07/16/2013  . Erythromycin  07/16/2013    Family History  Problem Relation Age of Onset  . Colon polyps Mother        67s  . Colon cancer Maternal Grandmother        27s    Social History   Socioeconomic History  . Marital status: Married    Spouse name: Not on file  . Number of children: Not on file  . Years of education: Not on file  . Highest education level: Not on file  Occupational History  . Not on file  Tobacco Use  . Smoking status: Former Games developer  . Smokeless tobacco: Never Used  Substance and Sexual Activity  . Alcohol use: No  . Drug use: No  . Sexual activity: Yes    Birth control/protection: Surgical  Other Topics Concern  . Not on file  Social  History Narrative  . Not on file   Social Determinants of Health   Financial Resource Strain: Not on file  Food Insecurity: Not on file  Transportation Needs: Not on file  Physical Activity: Not on file  Stress: Not on file  Social Connections: Not on file  Intimate Partner Violence: Not on file    Review of Systems: Gen: Denies any fever, chills, cold or flulike symptoms, presyncope, syncope. CV: Denies chest pain.  Admits to occasional palpitations in setting of mitral valve prolapse. Resp: Denies shortness of breath or cough. GI: See HPI GU : Denies urinary burning, urinary frequency, urinary hesitancy, hematuria. MS: Denies joint pain, muscle weakness, cramps, or limitation of movement.  Derm: Denies rash Psych: Admits to anxiety. Heme: See HPI  Physical Exam: BP 129/80   Pulse 79   Temp (!) 97.2 F (36.2 C)   Ht 5\' 8"  (1.727 m)   Wt 199 lb (90.3 kg)   LMP 06/20/2013   BMI 30.26 kg/m  General:   Alert and oriented. Pleasant and cooperative. Well-nourished and well-developed.  Head:  Normocephalic and atraumatic. Eyes:  Without icterus, sclera clear and conjunctiva pink.  Ears:  Normal auditory acuity. Lungs:  Clear to auscultation bilaterally. No wheezes, rales, or rhonchi. No distress.  Heart:  S1, S2 present without murmurs appreciated.  Abdomen:  +BS, soft, non-tender and non-distended. No HSM noted. No guarding or rebound. No masses appreciated.  Rectal:  Deferred  Msk:  Symmetrical without gross deformities. Normal posture. Extremities:  Without edema. Neurologic:  Alert and  oriented x4;  grossly normal neurologically. Skin:  Intact without significant lesions or rashes. Psych: Normal mood and affect.   Assessment: 50 year old female with history of MVP, trigeminal neuralgia, hypertension, insomnia, diabetes (diet-controlled), anxiety, chronic anemia on oral iron, gastric bypass in 2016 presenting today at the request of Dr. 2017 to schedule first ever  screening colonoscopy.  She reports chronic history of alternating constipation and diarrhea, though diarrhea is predominant with up to 5-6 postprandial loose bowel movements with associated abdominal cramping prior that improves thereafter.  Denies BRBPR, melena, unintentional weight loss, or abdominal pain between bowel movements.  Denies nocturnal bowel movements.  No significant upper GI symptoms.  Family history significant for mother with colon polyps in her 22s and maternal grandmother with colon cancer in her 21s.  Suspect loose stools may be secondary to bile salt diarrhea and/or IBS-diarrhea predominant.  We will also check for thyroid abnormalities and  screen for celiac disease. Likely start Bentyl after labs are completed.   Chronic anemia: Reports chronic history of anemia.  No recent labs on file though she reports having recent labs with Dr. Olena Leatherwood and was told everything looked good.  Hemoglobin 8.0 with microcytic indices in 2019.  Per patient, when her hemoglobin is good, it is generally in the 11 range.  She is chronically on oral iron.  Denies overt GI bleeding or NSAID use.  No prior colonoscopy.  Reports an EGD years ago.  She cannot remember exactly where this was performed.  She was told she had reflux.  Not sure about ulcerations.   Etiology of anemia is not clear.  History of gastric bypass with decreased iron absorption likely contributing.  Notably, patient reports anemia predates gastric bypass.  Will request recent labs from Dr. Olena Leatherwood for review.  She needs colonoscopy for colon cancer screening.  Consider adding EGD if anemia is present. Moving forward, she may need referral to hematology for IV iron in the setting of gastric bypass.   Plan: 1.  TSH, TTG IgA, IgA total.  2.  Request recent labs from Dr. Olena Leatherwood.   3.  Patient needs colonoscopy with propofol with Dr. Jena Gauss for colon cancer screening.  Consider adding EGD if evidence of anemia on recent labs completed  with PCP.  Further recommendations once I receive and review labs. - Notably, patient is requesting to hold off on procedures for a couple of months.  - She will need to hold iron for 7 days prior to procedures.  - ASA II  4.  We will likely start Bentyl 10 mg daily and increase up to 3 times daily before meals and at bedtime as needed if TSH and celiac screen are unrevealing.     Ermalinda Memos, PA-C [facility] Gastroenterology 11/07/2020

## 2020-11-07 ENCOUNTER — Encounter: Payer: Self-pay | Admitting: Gastroenterology

## 2020-11-07 ENCOUNTER — Other Ambulatory Visit: Payer: Self-pay

## 2020-11-07 ENCOUNTER — Ambulatory Visit: Payer: BC Managed Care – PPO | Admitting: Gastroenterology

## 2020-11-07 VITALS — BP 129/80 | HR 79 | Temp 97.2°F | Ht 68.0 in | Wt 199.0 lb

## 2020-11-07 DIAGNOSIS — D649 Anemia, unspecified: Secondary | ICD-10-CM

## 2020-11-07 DIAGNOSIS — Z1211 Encounter for screening for malignant neoplasm of colon: Secondary | ICD-10-CM | POA: Diagnosis not present

## 2020-11-07 DIAGNOSIS — R195 Other fecal abnormalities: Secondary | ICD-10-CM

## 2020-11-07 NOTE — Patient Instructions (Signed)
Please have labs completed at [facility].  We are requesting recent blood work from Dr. Olena Leatherwood for review.  Pending labs, we will consider starting you on a medication to help with loose stools.   We will also make a decision on proceeding with colonoscopy or colonoscopy and upper endoscopy once I review your labs from Dr. Olena Leatherwood.   It was great meeting you today!  Ermalinda Memos, PA-C [facility] Gastroenterology

## 2020-11-08 ENCOUNTER — Other Ambulatory Visit: Payer: Self-pay | Admitting: Gastroenterology

## 2020-11-08 DIAGNOSIS — R195 Other fecal abnormalities: Secondary | ICD-10-CM

## 2020-11-08 LAB — TSH: TSH: 1.41 mIU/L

## 2020-11-08 LAB — IGA: Immunoglobulin A: 149 mg/dL (ref 47–310)

## 2020-11-08 LAB — TISSUE TRANSGLUTAMINASE, IGA: (tTG) Ab, IgA: <1 U/mL

## 2020-11-08 MED ORDER — DICYCLOMINE HCL 10 MG PO CAPS: ORAL_CAPSULE | ORAL | 2 refills | Status: AC

## 2020-11-18 ENCOUNTER — Telehealth: Payer: Self-pay | Admitting: Gastroenterology

## 2020-11-18 NOTE — Telephone Encounter (Signed)
Darl Pikes, I have been waiting on labs from Dr. Olena Leatherwood for this patient since her OV on 4/11. Can we request labs again?

## 2020-11-21 ENCOUNTER — Other Ambulatory Visit: Payer: Self-pay

## 2020-11-21 DIAGNOSIS — D649 Anemia, unspecified: Secondary | ICD-10-CM

## 2020-11-21 NOTE — Telephone Encounter (Signed)
Reviewed labs completed 09/19/2020.  BMP: Glucose 101, BUN 10, creatinine 0.68, sodium 139, potassium 4.4, calcium 10.7 (H). Hemoglobin A1c 6.0 Lipid panel: Total cholesterol 183, triglycerides 102, HDL 61, LDL 84  Please let patient know I have received and reviewed her labs completed in February 2022 with primary care.  She did not have blood work to check her hemoglobin.  Recommend we update CBC and iron panel at this time.

## 2020-11-21 NOTE — Telephone Encounter (Signed)
Spoke with pt. Pt was notified that lab results were reviewed. Pt will complete labs at [facility]. Lab orders placed.

## 2020-11-21 NOTE — Telephone Encounter (Signed)
I put something of her's in your box this morning.

## 2020-11-25 LAB — CBC WITH DIFFERENTIAL/PLATELET
Basophils Absolute: 0 10*3/uL (ref 0.0–0.2)
Basos: 0 %
EOS (ABSOLUTE): 0.1 10*3/uL (ref 0.0–0.4)
Eos: 2 %
Hematocrit: 36.1 % (ref 34.0–46.6)
Hemoglobin: 11.3 g/dL (ref 11.1–15.9)
Immature Grans (Abs): 0 10*3/uL (ref 0.0–0.1)
Immature Granulocytes: 0 %
Lymphocytes Absolute: 2.3 10*3/uL (ref 0.7–3.1)
Lymphs: 29 %
MCH: 25.8 pg — ABNORMAL LOW (ref 26.6–33.0)
MCHC: 31.3 g/dL — ABNORMAL LOW (ref 31.5–35.7)
MCV: 82 fL (ref 79–97)
Monocytes Absolute: 0.5 10*3/uL (ref 0.1–0.9)
Monocytes: 6 %
Neutrophils Absolute: 5 10*3/uL (ref 1.4–7.0)
Neutrophils: 63 %
Platelets: 295 10*3/uL (ref 150–450)
RBC: 4.38 x10E6/uL (ref 3.77–5.28)
RDW: 15.6 % — ABNORMAL HIGH (ref 11.7–15.4)
WBC: 8 10*3/uL (ref 3.4–10.8)

## 2020-11-25 LAB — IRON,TIBC AND FERRITIN PANEL
Ferritin: 5 ng/mL — ABNORMAL LOW (ref 15–150)
Iron Saturation: 5 % — CL (ref 15–55)
Iron: 19 ug/dL — ABNORMAL LOW (ref 27–159)
Total Iron Binding Capacity: 372 ug/dL (ref 250–450)
UIBC: 353 ug/dL (ref 131–425)

## 2020-11-30 ENCOUNTER — Other Ambulatory Visit: Payer: Self-pay | Admitting: *Deleted

## 2020-11-30 DIAGNOSIS — D509 Iron deficiency anemia, unspecified: Secondary | ICD-10-CM

## 2020-12-14 ENCOUNTER — Encounter (HOSPITAL_COMMUNITY): Payer: Self-pay

## 2020-12-14 ENCOUNTER — Other Ambulatory Visit: Payer: Self-pay

## 2020-12-15 ENCOUNTER — Inpatient Hospital Stay (HOSPITAL_COMMUNITY): Payer: 59 | Attending: Hematology | Admitting: Hematology

## 2020-12-15 DIAGNOSIS — Z9884 Bariatric surgery status: Secondary | ICD-10-CM | POA: Diagnosis not present

## 2020-12-15 DIAGNOSIS — Z803 Family history of malignant neoplasm of breast: Secondary | ICD-10-CM

## 2020-12-15 DIAGNOSIS — Z801 Family history of malignant neoplasm of trachea, bronchus and lung: Secondary | ICD-10-CM | POA: Diagnosis not present

## 2020-12-15 DIAGNOSIS — D509 Iron deficiency anemia, unspecified: Secondary | ICD-10-CM

## 2020-12-15 DIAGNOSIS — Z87891 Personal history of nicotine dependence: Secondary | ICD-10-CM | POA: Insufficient documentation

## 2020-12-15 DIAGNOSIS — Z8371 Family history of colonic polyps: Secondary | ICD-10-CM | POA: Diagnosis not present

## 2020-12-15 DIAGNOSIS — Z800 Family history of malignant neoplasm of digestive organs: Secondary | ICD-10-CM | POA: Diagnosis not present

## 2020-12-15 NOTE — Progress Notes (Signed)
[facility] [address], [place] [address]   CLINIC:  Medical Oncology/Hematology  Patient Care Team: Toma Deiters, MD as PCP - General (Internal Medicine) Jena Gauss, Gerrit Friends, MD as Consulting Physician (Gastroenterology)  CHIEF COMPLAINTS/PURPOSE OF CONSULTATION:  Evaluation of IDA  HISTORY OF PRESENTING ILLNESS:  Karina Prince 50 y.o. female is here because of an evaluation of her IDA, at the request of [facility].  Today she reports feeling well. She reports decreased energy levels. She is taking vitamin D and vitamin B-12 tablets. She denies cravings for ice or dirt but reports craving for oil-cured black olives for a couple years. She has been taking iron tablets (one 60 mg tablet daily since gastric bypass), but she reports they cause nausea. She reports hot flashes and night sweats which she attributes to menopause. She reports SOB upon exertion and denies blood in stools. She reports erythromycin upsets her stomach, but denies other allergies to medicines.   She had gastric bypass surgery 6 years ago; she reports her issues with anemia began following this procedure. The procedure was successful, and she lot approx.100 lbs. She had a partial hysterectomy 3 years ago due to a prolapsed uterus. 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 She denies family history of anemia, and she has never had a blood transfusion. Her maternal aunt and maternal great-aunt had breast cancer. She has been scheduled to have her first colonoscopy by [facility].   MEDICAL HISTORY:  Past Medical History:  Diagnosis Date  . Anemia   . Anxiety   . Asthma   . Diabetes mellitus without complication (HCC)   . Hypertension   . Insomnia   . MVP (mitral valve prolapse)   . Trigeminal neuralgia     SURGICAL HISTORY: Past Surgical History:  Procedure Laterality Date  . APPENDECTOMY    . CHOLECYSTECTOMY  2005  . dilitation and curretage    . GASTRIC BYPASS  07/2014  .  TONSILLECTOMY    . TUBAL LIGATION    . VENTRAL HERNIA REPAIR  02/2015    SOCIAL HISTORY: Social History   Socioeconomic History  . Marital status: Married    Spouse name: Not on file  . Number of children: 4  . Years of education: Not on file  . Highest education level: Not on file  Occupational History  . Not on file  Tobacco Use  . Smoking status: Former Games developer  . Smokeless tobacco: Never Used  Substance and Sexual Activity  . Alcohol use: No  . Drug use: No  . Sexual activity: Yes    Birth control/protection: Surgical  Other Topics Concern  . Not on file  Social History Narrative  . Not on file   Social Determinants of Health   Financial Resource Strain: Medium Risk  . Difficulty of Paying Living Expenses: Somewhat hard  Food Insecurity: No Food Insecurity  . Worried About Programme researcher, broadcasting/film/video in the Last Year: Never true  . Ran Out of Food in the Last Year: Never true  Transportation Needs: No Transportation Needs  . Lack of Transportation (Medical): No  . Lack of Transportation (Non-Medical): No  Physical Activity: Inactive  . Days of Exercise per Week: 0 days  . Minutes of Exercise per Session: 0 min  Stress: Stress Concern Present  . Feeling of Stress : To some extent  Social Connections: Socially Integrated  . Frequency of Communication with Friends and Family: More than three times a  week  . Frequency of Social Gatherings with Friends and Family: Once a week  . Attends Religious Services: More than 4 times per year  . Active Member of Clubs or Organizations: Yes  . Attends BankerClub or Organization Meetings: More than 4 times per year  . Marital Status: Married  Catering managerntimate Partner Violence: Not At Risk  . Fear of Current or Ex-Partner: No  . Emotionally Abused: No  . Physically Abused: No  . Sexually Abused: No    FAMILY HISTORY: Family History  Problem Relation Age of Onset  . Colon polyps Mother        2840s  . Cataracts Mother   . Osteoarthritis  Mother   . Mitral valve prolapse Mother   . Colon cancer Maternal Grandmother        6370s  . Hypertension Sister   . Hypertension Brother   . Breast cancer Maternal Aunt   . Lung cancer Maternal Grandfather   . Hypertension Son   . Hypertension Daughter   . Rheum arthritis Daughter   . Migraines Daughter     ALLERGIES:  is allergic to adhesive [tape] and erythromycin.  MEDICATIONS:  Current Outpatient Medications  Medication Sig Dispense Refill  . benazepril (LOTENSIN) 5 MG tablet Take 5 mg by mouth daily.    Marland Kitchen. BIOTIN PO Take by mouth daily.    Marland Kitchen. CALCIUM PO Take by mouth daily.    . clonazePAM (KLONOPIN) 1 MG tablet Take 1 mg by mouth daily.    . Cyanocobalamin (B-12 PO) Take by mouth daily.    Marland Kitchen. dicyclomine (BENTYL) 10 MG capsule Take 1 capsule (10 mg total) by mouth once every morning before breakfast. May increase up to 3 times daily before meals and at bedtime as needed for loose stools and abdominal cramping. Hold in the setting of constipation. 90 capsule 2  . ferrous sulfate 325 (65 FE) MG EC tablet Take 325 mg by mouth daily.    Marland Kitchen. gabapentin (NEURONTIN) 300 MG capsule Take 300 mg by mouth at bedtime.    . Glucosamine-Chondroitin (MOVE FREE PO) Take by mouth daily.    . Methocarbamol (ROBAXIN PO) Take by mouth daily. (Patient not taking: Reported on 12/14/2020)    . Multiple Vitamin (MULTIVITAMIN) tablet Take 1 tablet by mouth daily.    . traZODone (DESYREL) 100 MG tablet Take 100 mg by mouth at bedtime.    Marland Kitchen. venlafaxine (EFFEXOR) 75 MG tablet Take 75 mg by mouth daily.     No current facility-administered medications for this visit.    REVIEW OF SYSTEMS:   Review of Systems  Constitutional: Positive for appetite change (75%) and fatigue (50%).  Respiratory: Positive for shortness of breath (w/ exertion).   Gastrointestinal: Positive for constipation and diarrhea. Negative for blood in stool.  Endocrine: Positive for hot flashes.  Psychiatric/Behavioral: Positive for  sleep disturbance.  All other systems reviewed and are negative.    PHYSICAL EXAMINATION: ECOG PERFORMANCE STATUS: 0 - Asymptomatic  Vitals:   12/15/20 0817  BP: 115/66  Pulse: 64  Resp: 18  Temp: (!) 97 F (36.1 C)  SpO2: 98%   Filed Weights   12/15/20 0817  Weight: 197 lb 9.6 oz (89.6 kg)   Physical Exam Vitals reviewed.  Constitutional:      Appearance: Normal appearance.  Cardiovascular:     Rate and Rhythm: Normal rate and regular rhythm.     Pulses: Normal pulses.     Heart sounds: Normal heart sounds.  Pulmonary:  Effort: Pulmonary effort is normal.     Breath sounds: Normal breath sounds.  Abdominal:     Palpations: Abdomen is soft. There is no hepatomegaly, splenomegaly or mass.     Tenderness: There is no abdominal tenderness.  Musculoskeletal:     Right lower leg: No edema.     Left lower leg: No edema.  Neurological:     General: No focal deficit present.     Mental Status: She is alert and oriented to person, place, and time.  Psychiatric:        Mood and Affect: Mood normal.        Behavior: Behavior normal.      LABORATORY DATA:  I have reviewed the data as listed Recent Results (from the past 2160 hour(s))  IgA     Status: None   Collection Time: 11/07/20 10:55 AM  Result Value Ref Range   Immunoglobulin A 149 47 - 310 mg/dL  Tissue transglutaminase, IgA     Status: None   Collection Time: 11/07/20 10:55 AM  Result Value Ref Range   (tTG) Ab, IgA <1.0 U/mL    Comment: Value          Interpretation -----          -------------- <15.0          Antibody not detected > or = 15.0    Antibody detected .   TSH     Status: None   Collection Time: 11/07/20 10:55 AM  Result Value Ref Range   TSH 1.41 mIU/L    Comment:           Reference Range .           > or = 20 Years  0.40-4.50 .                Pregnancy Ranges           First trimester    0.26-2.66           Second trimester   0.55-2.73           Third trimester    0.43-2.91    CBC with Differential     Status: Abnormal   Collection Time: 11/24/20 11:08 AM  Result Value Ref Range   WBC 8.0 3.4 - 10.8 x10E3/uL   RBC 4.38 3.77 - 5.28 x10E6/uL   Hemoglobin 11.3 11.1 - 15.9 g/dL   Hematocrit 51.7 00.1 - 46.6 %   MCV 82 79 - 97 fL   MCH 25.8 (L) 26.6 - 33.0 pg   MCHC 31.3 (L) 31.5 - 35.7 g/dL   RDW 74.9 (H) 44.9 - 67.5 %   Platelets 295 150 - 450 x10E3/uL   Neutrophils 63 Not Estab. %   Lymphs 29 Not Estab. %   Monocytes 6 Not Estab. %   Eos 2 Not Estab. %   Basos 0 Not Estab. %   Neutrophils Absolute 5.0 1.4 - 7.0 x10E3/uL   Lymphocytes Absolute 2.3 0.7 - 3.1 x10E3/uL   Monocytes Absolute 0.5 0.1 - 0.9 x10E3/uL   EOS (ABSOLUTE) 0.1 0.0 - 0.4 x10E3/uL   Basophils Absolute 0.0 0.0 - 0.2 x10E3/uL   Immature Granulocytes 0 Not Estab. %   Immature Grans (Abs) 0.0 0.0 - 0.1 x10E3/uL  Fe+TIBC+Fer     Status: Abnormal   Collection Time: 11/24/20 11:10 AM  Result Value Ref Range   Total Iron Binding Capacity 372 250 - 450 ug/dL   UIBC 916 384 -  425 ug/dL   Iron 19 (L) 27 - 264 ug/dL   Iron Saturation 5 (LL) 15 - 55 %   Ferritin 5 (L) 15 - 150 ng/mL    RADIOGRAPHIC STUDIES: I have personally reviewed the radiological images as listed and agreed with the findings in the report. No results found.  ASSESSMENT:  1.  Iron deficiency anemia: - She had gastric bypass surgery 6 years ago at [facility].  She lost 100 pounds and gained about 7 pounds from her lowest weight. - She has been taking iron tablet daily since gastric bypass surgery.  She also takes sublingual B12 and vitamin D supplements. - She denies any previous blood transfusion.  She had TAH 3 years ago for prolapsed uterus. - She denies any ice pica but has cravings for oral cured black olives and eats them on regular basis.  2.  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 - No family history of anemia or thalassemia.  Maternal aunt had breast cancer.  Maternal  great aunt had breast cancer.  PLAN:  1.  Iron deficiency anemia: - We reviewed labs from 11/16/2020.  Hemoglobin is 11.3 with MCV 82.  MCH and MCHC were low with RDW elevated. - Ferritin was low at 5 and percent saturation was 5%. - She was evaluated by gastroenterology and being planned for colonoscopy and possibly EGD in the next few months. - She has been on iron supplements since her gastric bypass surgery 6 years ago and she takes them daily.  Iron pills make her nauseous.  Despite that there is no improvement in her iron levels. - We have discussed parenteral iron therapy with Feraheme weekly x2 to replenish iron stores. - We talked about rare chance of allergic reactions to [facility].  She has very mild side effects from erythromycin with stomach upset.  Hence I do not believe premedication is necessary with steroids.  She will take Tylenol and Benadryl at home on the day of infusion. - RTC 6 months with labs including ferritin and iron panel.   All questions were answered. The patient knows to call the clinic with any problems, questions or concerns.    Doreatha Massed, MD 12/15/20 5:30 PM  [facility] [phone]   I, Alda Ponder, am acting as a scribe for Dr. Doreatha Massed.  I, Doreatha Massed MD, have reviewed the above documentation for accuracy and completeness, and I agree with the above.

## 2020-12-15 NOTE — Patient Instructions (Addendum)
[facility] at [facility] Discharge Instructions  You were seen today by Dr. Ellin Saba. He went over your recent results. You will be scheduled to receive iron infusions (Feraheme). Dr. Ellin Saba will see you back in 6 months for labs and follow up.   Thank you for choosing [facility] at [facility] to provide your oncology and hematology care.  To afford each patient quality time with our provider, please arrive at least 15 minutes before your scheduled appointment time.   If you have a lab appointment with the Cancer Center please come in thru the Main Entrance and check in at the main information desk  You need to re-schedule your appointment should you arrive 10 or more minutes late.  We strive to give you quality time with our providers, and arriving late affects you and other patients whose appointments are after yours.  Also, if you no show three or more times for appointments you may be dismissed from the clinic at the providers discretion.     Again, thank you for choosing [facility].  Our hope is that these requests will decrease the amount of time that you wait before being seen by our physicians.       _____________________________________________________________  Should you have questions after your visit to [facility], please contact our office at [phone] between the hours of 8:00 a.m. and 4:30 p.m.  Voicemails left after 4:00 p.m. will not be returned until the following business day.  For prescription refill requests, have your pharmacy contact our office and allow 72 hours.    Cancer Center Support Programs:   > Cancer Support Group  2nd Tuesday of the month 1pm-2pm, Journey Room

## 2020-12-19 ENCOUNTER — Ambulatory Visit (HOSPITAL_COMMUNITY): Payer: 59

## 2020-12-28 ENCOUNTER — Inpatient Hospital Stay (HOSPITAL_COMMUNITY): Payer: 59 | Attending: Hematology

## 2020-12-28 ENCOUNTER — Other Ambulatory Visit: Payer: Self-pay

## 2020-12-28 ENCOUNTER — Encounter (HOSPITAL_COMMUNITY): Payer: Self-pay

## 2020-12-28 VITALS — BP 112/64 | HR 78 | Temp 97.2°F | Resp 18

## 2020-12-28 DIAGNOSIS — D509 Iron deficiency anemia, unspecified: Secondary | ICD-10-CM | POA: Diagnosis not present

## 2020-12-28 MED ORDER — SODIUM CHLORIDE 0.9 % IV SOLN: Freq: Once | INTRAVENOUS | Status: AC

## 2020-12-28 MED ORDER — SODIUM CHLORIDE 0.9 % IV SOLN
510.0000 mg | Freq: Once | INTRAVENOUS | Status: AC
  Administered 2020-12-28: 510 mg via INTRAVENOUS
  Filled 2020-12-28: qty 510

## 2020-12-28 MED ORDER — LORATADINE 10 MG PO TABS
10.0000 mg | ORAL_TABLET | Freq: Once | ORAL | Status: AC
  Administered 2020-12-28: 10 mg via ORAL

## 2020-12-28 MED ORDER — ACETAMINOPHEN 325 MG PO TABS
ORAL_TABLET | ORAL | Status: AC
  Filled 2020-12-28: qty 2

## 2020-12-28 MED ORDER — LORATADINE 10 MG PO TABS
ORAL_TABLET | ORAL | Status: AC
  Filled 2020-12-28: qty 1

## 2020-12-28 MED ORDER — ACETAMINOPHEN 325 MG PO TABS
650.0000 mg | ORAL_TABLET | Freq: Once | ORAL | Status: AC
Start: 2020-12-28 — End: 2020-12-28
  Administered 2020-12-28: 650 mg via ORAL

## 2020-12-28 NOTE — Progress Notes (Signed)
Iron infusion given per orders. Patient tolerated it well without problems. Vitals stable and discharged home from clinic ambulatory. Follow up as scheduled.  

## 2020-12-28 NOTE — Patient Instructions (Signed)
[facility]  Discharge Instructions: Thank you for choosing [facility] to provide your oncology and hematology care.  If you have a lab appointment with the Cancer Center, please come in thru the Main Entrance and check in at the main information desk.  Wear comfortable clothing and clothing appropriate for easy access to any Portacath or PICC line.   We strive to give you quality time with your provider. You may need to reschedule your appointment if you arrive late (15 or more minutes).  Arriving late affects you and other patients whose appointments are after yours.  Also, if you miss three or more appointments without notifying the office, you may be dismissed from the clinic at the provider's discretion.      For prescription refill requests, have your pharmacy contact our office and allow 72 hours for refills to be completed.       TItems with * indicate a potential emergency and should be followed up as soon as possible or go to the Emergency Department if any problems should occur.    Should you have questions after your visit or need to cancel or reschedule your appointment, please contact [facility] [phone]  and follow the prompts.  Office hours are 8:00 a.m. to 4:30 p.m. Monday - Friday. Please note that voicemails left after 4:00 p.m. may not be returned until the following business day.  We are closed weekends and major holidays. You have access to a nurse at all times for urgent questions. Please call the main number to the clinic [phone] and follow the prompts.  For any non-urgent questions, you may also contact your provider using MyChart. We now offer e-Visits for anyone 79 and older to request care online for non-urgent symptoms. For details visit [email or web address].   Also download the MyChart app! Go to the app store, search "MyChart", open the app, select [facility], and log in with your MyChart username and  password.  Due to Covid, a mask is required upon entering the hospital/clinic. If you do not have a mask, one will be given to you upon arrival. For doctor visits, patients may have 1 support person aged 42 or older with them. For treatment visits, patients cannot have anyone with them due to current Covid guidelines and our immunocompromised population.

## 2021-01-02 ENCOUNTER — Other Ambulatory Visit: Payer: Self-pay

## 2021-01-02 ENCOUNTER — Telehealth: Payer: Self-pay

## 2021-01-02 MED ORDER — PEG 3350-KCL-NA BICARB-NACL 420 G PO SOLR: 4000.0000 mL | ORAL | 0 refills | Status: DC

## 2021-01-02 NOTE — Telephone Encounter (Signed)
Called pt, TCS/EGD w/Propofol ASA 2 w/ Dr. Jena Gauss scheduled for 02/02/21 at 9:30am. Advised her hold Iron for 7 days prior to procedure. Instructions mailed. Orders entered.

## 2021-01-04 ENCOUNTER — Ambulatory Visit (HOSPITAL_COMMUNITY): Payer: 59

## 2021-01-23 ENCOUNTER — Other Ambulatory Visit: Payer: Self-pay

## 2021-01-23 ENCOUNTER — Inpatient Hospital Stay (HOSPITAL_COMMUNITY): Payer: 59

## 2021-01-23 VITALS — BP 121/74 | HR 60 | Temp 96.8°F | Resp 18

## 2021-01-23 DIAGNOSIS — D509 Iron deficiency anemia, unspecified: Secondary | ICD-10-CM

## 2021-01-23 MED ORDER — SODIUM CHLORIDE 0.9 % IV SOLN: Freq: Once | INTRAVENOUS | Status: AC

## 2021-01-23 MED ORDER — METHYLPREDNISOLONE SODIUM SUCC 125 MG IJ SOLR
125.0000 mg | Freq: Once | INTRAMUSCULAR | Status: AC | PRN
  Administered 2021-01-23: 125 mg via INTRAVENOUS

## 2021-01-23 MED ORDER — METHYLPREDNISOLONE SODIUM SUCC 125 MG IJ SOLR
INTRAMUSCULAR | Status: AC
  Filled 2021-01-23: qty 2

## 2021-01-23 MED ORDER — SODIUM CHLORIDE 0.9 % IV SOLN
510.0000 mg | Freq: Once | INTRAVENOUS | Status: AC
  Administered 2021-01-23: 510 mg via INTRAVENOUS
  Filled 2021-01-23: qty 17

## 2021-01-23 MED ORDER — DIPHENHYDRAMINE HCL 50 MG/ML IJ SOLN
INTRAMUSCULAR | Status: AC
  Filled 2021-01-23: qty 1

## 2021-01-23 MED ORDER — LORATADINE 10 MG PO TABS
10.0000 mg | ORAL_TABLET | Freq: Once | ORAL | Status: AC
  Administered 2021-01-23: 10 mg via ORAL
  Filled 2021-01-23: qty 1

## 2021-01-23 MED ORDER — DIPHENHYDRAMINE HCL 50 MG/ML IJ SOLN
50.0000 mg | Freq: Once | INTRAMUSCULAR | Status: AC | PRN
  Administered 2021-01-23: 50 mg via INTRAVENOUS

## 2021-01-23 MED ORDER — ACETAMINOPHEN 325 MG PO TABS
650.0000 mg | ORAL_TABLET | Freq: Once | ORAL | Status: AC
  Administered 2021-01-23: 650 mg via ORAL
  Filled 2021-01-23: qty 2

## 2021-01-23 NOTE — Progress Notes (Signed)
Patient called out for nurse at 1508, patient stated her ears and legs were itching, sneezing repeatedly, no rash noted, denies SOB, no chest pain. Iron infusion stopped immediately. New bag of plain normal saline hanging, vitals obtained. PA notified. Patient now complaining of her throat feeling tight, Oxygen 2 liters applied. PA in room for assessment . NR to give solumedrol and benadryl per orders. Will monitor for 30 min and recheck for symptoms per PA.  [id number] for discharge per PA. Vitals stable, no symptoms at this time. Will call patient to reschedule tomorrow. discharged home from clinic ambulatory. Follow up as scheduled.

## 2021-01-23 NOTE — Patient Instructions (Signed)
[facility]  Discharge Instructions: Thank you for choosing [facility] to provide your oncology and hematology care.  If you have a lab appointment with the Cancer Center, please come in thru the Main Entrance and check in at the main information desk.     We strive to give you quality time with your provider. You may need to reschedule your appointment if you arrive late (15 or more minutes).  Arriving late affects you and other patients whose appointments are after yours.  Also, if you miss three or more appointments without notifying the office, you may be dismissed from the clinic at the provider's discretion.      For prescription refill requests, have your pharmacy contact our office and allow 72 hours for refills to be completed.       If you start having any new symptoms such as SOB, or chest pain, tightness, or you feel like your throat is closing up, go to the emergency room as as possible or call 911.        Should you have questions after your visit or need to cancel or reschedule your appointment, please contact [facility] [phone]  and follow the prompts.  Office hours are 8:00 a.m. to 4:30 p.m. Monday - Friday. Please note that voicemails left after 4:00 p.m. may not be returned until the following business day.  We are closed weekends and major holidays. You have access to a nurse at all times for urgent questions. Please call the main number to the clinic [phone] and follow the prompts.  For any non-urgent questions, you may also contact your provider using MyChart. We now offer e-Visits for anyone 69 and older to request care online for non-urgent symptoms. For details visit [email or web address].   Also download the MyChart app! Go to the app store, search "MyChart", open the app, select [facility], and log in with your MyChart username and password.  Due to Covid, a mask is required upon entering the hospital/clinic. If  you do not have a mask, one will be given to you upon arrival. For doctor visits, patients may have 1 support person aged 55 or older with them. For treatment visits, patients cannot have anyone with them due to current Covid guidelines and our immunocompromised population.

## 2021-01-25 ENCOUNTER — Other Ambulatory Visit (HOSPITAL_COMMUNITY): Payer: Self-pay | Admitting: Physician Assistant

## 2021-01-25 NOTE — Progress Notes (Addendum)
Patient experienced drug reaction to [facility] during infusion on 01/23/2021 with feelings of itching in her face and throat as well as a feeling of chest tightness.  She was given Solu-Medrol and Benadryl with resolution of symptoms, and was sent home in stable condition.  Due to a reaction to Feraheme, iron infusion was switched to Venofer.  She will be scheduled for Venofer 300 mg x 2 doses to complete IV iron repletion.  We will plan on premedication with IV Solu-Medrol, Benadryl, and Pepcid.  After this, she can follow-up as previously scheduled for repeat labs and office visit.

## 2021-02-02 ENCOUNTER — Ambulatory Visit (HOSPITAL_COMMUNITY): Payer: 59 | Admitting: Anesthesiology

## 2021-02-02 ENCOUNTER — Other Ambulatory Visit: Payer: Self-pay

## 2021-02-02 ENCOUNTER — Encounter (HOSPITAL_COMMUNITY)
Admission: RE | Disposition: A | Discharge status: Home / Self Care | Payer: Self-pay | Source: Home / Self Care | Attending: Internal Medicine

## 2021-02-02 ENCOUNTER — Encounter (HOSPITAL_COMMUNITY): Payer: Self-pay | Admitting: Internal Medicine

## 2021-02-02 ENCOUNTER — Ambulatory Visit (HOSPITAL_COMMUNITY)
Admission: RE | Admit: 2021-02-02 | Discharge: 2021-02-02 | Disposition: A | Discharge status: Home / Self Care | Payer: 59 | Attending: Internal Medicine | Admitting: Internal Medicine

## 2021-02-02 DIAGNOSIS — K317 Polyp of stomach and duodenum: Secondary | ICD-10-CM | POA: Diagnosis not present

## 2021-02-02 DIAGNOSIS — Z888 Allergy status to other drugs, medicaments and biological substances status: Secondary | ICD-10-CM | POA: Diagnosis not present

## 2021-02-02 DIAGNOSIS — Z803 Family history of malignant neoplasm of breast: Secondary | ICD-10-CM | POA: Insufficient documentation

## 2021-02-02 DIAGNOSIS — Z87891 Personal history of nicotine dependence: Secondary | ICD-10-CM | POA: Diagnosis not present

## 2021-02-02 DIAGNOSIS — Z8371 Family history of colonic polyps: Secondary | ICD-10-CM | POA: Diagnosis not present

## 2021-02-02 DIAGNOSIS — Z1211 Encounter for screening for malignant neoplasm of colon: Secondary | ICD-10-CM | POA: Insufficient documentation

## 2021-02-02 DIAGNOSIS — D509 Iron deficiency anemia, unspecified: Secondary | ICD-10-CM | POA: Insufficient documentation

## 2021-02-02 DIAGNOSIS — Z9884 Bariatric surgery status: Secondary | ICD-10-CM | POA: Diagnosis not present

## 2021-02-02 DIAGNOSIS — Z980 Intestinal bypass and anastomosis status: Secondary | ICD-10-CM | POA: Diagnosis not present

## 2021-02-02 DIAGNOSIS — Z801 Family history of malignant neoplasm of trachea, bronchus and lung: Secondary | ICD-10-CM | POA: Insufficient documentation

## 2021-02-02 DIAGNOSIS — Z79899 Other long term (current) drug therapy: Secondary | ICD-10-CM | POA: Diagnosis not present

## 2021-02-02 DIAGNOSIS — Z881 Allergy status to other antibiotic agents status: Secondary | ICD-10-CM | POA: Insufficient documentation

## 2021-02-02 DIAGNOSIS — Z800 Family history of malignant neoplasm of digestive organs: Secondary | ICD-10-CM | POA: Diagnosis not present

## 2021-02-02 HISTORY — PX: BIOPSY: SHX5522

## 2021-02-02 HISTORY — PX: COLONOSCOPY WITH PROPOFOL: SHX5780

## 2021-02-02 HISTORY — PX: ESOPHAGOGASTRODUODENOSCOPY (EGD) WITH PROPOFOL: SHX5813

## 2021-02-02 SURGERY — COLONOSCOPY WITH PROPOFOL
Anesthesia: General

## 2021-02-02 MED ORDER — PROPOFOL 10 MG/ML IV BOLUS
INTRAVENOUS | Status: DC | PRN
  Administered 2021-02-02: 100 mg via INTRAVENOUS
  Administered 2021-02-02: 125 ug/kg/min via INTRAVENOUS

## 2021-02-02 MED ORDER — LACTATED RINGERS IV SOLN: INTRAVENOUS | Status: DC

## 2021-02-02 MED ORDER — LIDOCAINE HCL (CARDIAC) PF 100 MG/5ML IV SOSY
PREFILLED_SYRINGE | INTRAVENOUS | Status: DC | PRN
  Administered 2021-02-02: 40 mg via INTRAVENOUS

## 2021-02-02 NOTE — Op Note (Signed)
[facility] Patient Name: Karina Prince Procedure Date: 02/02/2021 9:22 AM MRN: [id number] Date of Birth: 02-21-1971 Attending MD: Gennette Pac , MD CSN: [id number] Age: 50 Admit Type: Outpatient Procedure:                Upper GI endoscopy Indications:              Iron deficiency anemia Providers:                Gennette Pac, MD, Brain Hilts, RN, Burke Keels, Technician Referring MD:              Medicines:                Propofol per Anesthesia Complications:            No immediate complications. Estimated Blood Loss:     Estimated blood loss was minimal. Procedure:                Pre-Anesthesia Assessment:                           - Prior to the procedure, a History and Physical                            was performed, and patient medications and                            allergies were reviewed. The patient's tolerance of                            previous anesthesia was also reviewed. The risks                            and benefits of the procedure and the sedation                            options and risks were discussed with the patient.                            All questions were answered, and informed consent                            was obtained. Prior Anticoagulants: The patient has                            taken no previous anticoagulant or antiplatelet                            agents. ASA Grade Assessment: II - A patient with                            mild systemic disease. After reviewing the risks  and benefits, the patient was deemed in                            satisfactory condition to undergo the procedure.                           After obtaining informed consent, the endoscope was                            passed under direct vision. Throughout the                            procedure, the patient's blood pressure, pulse, and                            oxygen  saturations were monitored continuously. The                            [id number]-H190 ([id number]) was introduced through the                            mouth, and advanced to the efferent jejunal loop.                            The upper GI endoscopy was accomplished without                            difficulty. The patient tolerated the procedure                            well. Scope In: 9:25:14 AM Scope Out: 9:28:57 AM Total Procedure Duration: 0 hours 3 minutes 43 seconds  Findings:      The examined esophagus was normal. Surgically altered stomach. Some       nodularity at anastomosis with single E variant limb. Very short a       ferret small bowel limb. E ferret limb appeared entirely normal.       Biopsies of gastric anastomosis taken. Impression:               - Normal esophagus.                           Surgically altered stomach consistent with RYGB.                            Abnormal anastomosis most likely benign?"related to                            postsurgical changes?"status post biopsy. Moderate Sedation:      Moderate (conscious) sedation was personally administered by an       anesthesia professional. The following parameters were monitored: oxygen       saturation, heart rate, blood pressure, respiratory rate, EKG, adequacy       of pulmonary ventilation, and response to care. Recommendation:           - Patient has a  contact number available for                            emergencies. The signs and symptoms of potential                            delayed complications were discussed with the                            patient. Return to normal activities tomorrow.                            Written discharge instructions were provided to the                            patient.                           - Advance diet as tolerated. Follow-up on                            pathology. Office visit with [place] in 2 months. See                            colonoscopy  report. Procedure Code(s):        --- Professional ---                           [phone], Esophagogastroduodenoscopy, flexible,                            transoral; diagnostic, including collection of                            specimen(s) by brushing or washing, when performed                            (separate procedure) Diagnosis Code(s):        --- Professional ---                           D50.9, Iron deficiency anemia, unspecified CPT copyright 2019 American Medical Association. All rights reserved. The codes documented in this report are preliminary and upon coder review may  be revised to meet current compliance requirements. Gerrit Friends. Siah Kannan, MD Gennette Pac, MD 02/02/2021 9:45:18 AM This report has been signed electronically. Number of Addenda: 0

## 2021-02-02 NOTE — Anesthesia Postprocedure Evaluation (Signed)
Anesthesia Post Note  Patient: Karina Prince  Procedure(s) Performed: COLONOSCOPY WITH PROPOFOL ESOPHAGOGASTRODUODENOSCOPY (EGD) WITH PROPOFOL BIOPSY  Patient location during evaluation: Endoscopy Anesthesia Type: General Level of consciousness: awake and alert and oriented Pain management: pain level controlled Vital Signs Assessment: post-procedure vital signs reviewed and stable Respiratory status: spontaneous breathing and respiratory function stable Cardiovascular status: blood pressure returned to baseline and stable Postop Assessment: no apparent nausea or vomiting Anesthetic complications: no   No notable events documented.   Last Vitals:  Vitals:   02/02/21 0817 02/02/21 0933  BP: 117/85 99/64  Pulse:  63  Resp: 14 19  Temp: 36.4 C (!) 36.4 C  SpO2: 99% 97%    Last Pain:  Vitals:   02/02/21 0933  TempSrc: Oral  PainSc: 0-No pain                 Anahit Klumb C Lennette Fader

## 2021-02-02 NOTE — Discharge Instructions (Addendum)
  Colonoscopy Discharge Instructions  Read the instructions outlined below and refer to this sheet in the next few weeks. These discharge instructions provide you with general information on caring for yourself after you leave the hospital. Your doctor may also give you specific instructions. While your treatment has been planned according to the most current medical practices available, unavoidable complications occasionally occur. If you have any problems or questions after discharge, call Dr. Jena Gauss at [phone]. ACTIVITY You may resume your regular activity, but move at a slower pace for the next 24 hours.  Take frequent rest periods for the next 24 hours.  Walking will help get rid of the air and reduce the bloated feeling in your belly (abdomen).  No driving for 24 hours (because of the medicine (anesthesia) used during the test).   Do not sign any important legal documents or operate any machinery for 24 hours (because of the anesthesia used during the test).  NUTRITION Drink plenty of fluids.  You may resume your normal diet as instructed by your doctor.  Begin with a light meal and progress to your normal diet. Heavy or fried foods are harder to digest and may make you feel sick to your stomach (nauseated).  Avoid alcoholic beverages for 24 hours or as instructed.  MEDICATIONS You may resume your normal medications unless your doctor tells you otherwise.  WHAT YOU CAN EXPECT TODAY Some feelings of bloating in the abdomen.  Passage of more gas than usual.  Spotting of blood in your stool or on the toilet paper.  IF YOU HAD POLYPS REMOVED DURING THE COLONOSCOPY: No aspirin products for 7 days or as instructed.  No alcohol for 7 days or as instructed.  Eat a soft diet for the next 24 hours.  FINDING OUT THE RESULTS OF YOUR TEST Not all test results are available during your visit. If your test results are not back during the visit, make an appointment with your caregiver to find out the  results. Do not assume everything is normal if you have not heard from your caregiver or the medical facility. It is important for you to follow up on all of your test results.  SEEK IMMEDIATE MEDICAL ATTENTION IF: You have more than a spotting of blood in your stool.  Your belly is swollen (abdominal distention).  You are nauseated or vomiting.  You have a temperature over 101.  You have abdominal pain or discomfort that is severe or gets worse throughout the day.       Your colonoscopy was normal.  I recommend a repeat colonoscopy in 5 years  Your stomach looked a little inflamed.  Biopsies taken.  Further recommendations to follow pending review of pathology report  Office visit with Ermalinda Memos in 2 months  At patient request, I called Margo Aye at [phone] - rolled to voicemail.  "Mailbox was full"

## 2021-02-02 NOTE — Op Note (Addendum)
[facility] Patient Name: Karina Prince Procedure Date: 02/02/2021 8:50 AM MRN: [id number] Date of Birth: 10/25/1970 Attending MD: Gennette Pac , MD CSN: [id number] Age: 50 Admit Type: Outpatient Procedure:                Colonoscopy Indications:              Colon cancer screening in patient at increased                            risk: Family history of 1st-degree relative with                            colon polyps before age 53 years Providers:                Gennette Pac, MD, Brain Hilts, RN, Burke Keels, Technician Referring MD:              Medicines:                Propofol per Anesthesia Complications:            No immediate complications. Estimated Blood Loss:     Estimated blood loss was minimal. Procedure:                Pre-Anesthesia Assessment:                           - Prior to the procedure, a History and Physical                            was performed, and patient medications and                            allergies were reviewed. The patient's tolerance of                            previous anesthesia was also reviewed. The risks                            and benefits of the procedure and the sedation                            options and risks were discussed with the patient.                            All questions were answered, and informed consent                            was obtained. Prior Anticoagulants: The patient has                            taken no previous anticoagulant or antiplatelet  agents. ASA Grade Assessment: II - A patient with                            mild systemic disease. After reviewing the risks                            and benefits, the patient was deemed in                            satisfactory condition to undergo the procedure.                           After obtaining informed consent, the colonoscope                            was passed  under direct vision. Throughout the                            procedure, the patient's blood pressure, pulse, and                            oxygen saturations were monitored continuously. The                            [id number] ([id number]) scope was introduced through                            the anus and advanced to the 10 cm into the ileum.                            The colonoscopy was performed without difficulty.                            The patient tolerated the procedure well. The                            quality of the bowel preparation was adequate. Scope In: 9:03:55 AM Scope Out: 9:20:24 AM Scope Withdrawal Time: 0 hours 11 minutes 18 seconds  Total Procedure Duration: 0 hours 16 minutes 29 seconds  Findings:      The perianal and digital rectal examinations were normal. Distal 10 cm       TI appeared normal      The colon (entire examined portion) appeared normal. Impression:               - The entire examined colon is normal.                           - No specimens collected. Moderate Sedation:      Moderate (conscious) sedation was personally administered by an       anesthesia professional. The following parameters were monitored: oxygen       saturation, heart rate, blood pressure, respiratory rate, EKG, adequacy       of pulmonary ventilation, and response to care. Recommendation:           -  Patient has a contact number available for                            emergencies. The signs and symptoms of potential                            delayed complications were discussed with the                            patient. Return to normal activities tomorrow.                            Written discharge instructions were provided to the                            patient.                           - Advance diet as tolerated. Procedure Code(s):        --- Professional ---                           [phone], Colonoscopy, flexible; diagnostic, including                             collection of specimen(s) by brushing or washing,                            when performed (separate procedure) Diagnosis Code(s):        --- Professional ---                           Z83.71, Family history of colonic polyps CPT copyright 2019 American Medical Association. All rights reserved. The codes documented in this report are preliminary and upon coder review may  be revised to meet current compliance requirements. Gerrit Friends. Pj Zehner, MD Gennette Pac, MD 02/02/2021 9:34:12 AM This report has been signed electronically. Number of Addenda: 0

## 2021-02-02 NOTE — Transfer of Care (Signed)
Immediate Anesthesia Transfer of Care Note  Patient: Karina Prince  Procedure(s) Performed: COLONOSCOPY WITH PROPOFOL ESOPHAGOGASTRODUODENOSCOPY (EGD) WITH PROPOFOL BIOPSY  Patient Location: Short Stay  Anesthesia Type:General  Level of Consciousness: awake, drowsy, patient cooperative and responds to stimulation  Airway & Oxygen Therapy: Patient Spontanous Breathing  Post-op Assessment: Report given to RN, Post -op Vital signs reviewed and stable, Patient moving all extremities and Patient moving all extremities X 4  Post vital signs: Reviewed and stable  Last Vitals:  Vitals Value Taken Time  BP    Temp    Pulse    Resp    SpO2      Last Pain:  Vitals:   02/02/21 0900  TempSrc:   PainSc: 0-No pain      Patients Stated Pain Goal: 7 (02/02/21 0817)  Complications: No notable events documented.

## 2021-02-02 NOTE — Anesthesia Preprocedure Evaluation (Addendum)
Anesthesia Evaluation  Patient identified by MRN, date of birth, ID band Patient awake    Reviewed: Allergy & Precautions, NPO status , Patient's Chart, lab work & pertinent test results  History of Anesthesia Complications Negative for: history of anesthetic complications  Airway Mallampati: II  TM Distance: >3 FB Neck ROM: Full    Dental  (+) Dental Advisory Given, Teeth Intact   Pulmonary asthma , former smoker,    Pulmonary exam normal breath sounds clear to auscultation       Cardiovascular Exercise Tolerance: Good hypertension, Pt. on medications Normal cardiovascular exam+ Valvular Problems/Murmurs MVP  Rhythm:Regular Rate:Normal     Neuro/Psych Anxiety  Neuromuscular disease (trigeminal neuralgia)    GI/Hepatic Neg liver ROS, GERD (mild)  ,Gastric bypass   Endo/Other  diabetes, Well Controlled, Type 2, Oral Hypoglycemic Agents  Renal/GU negative Renal ROS     Musculoskeletal  (+) Arthritis ,   Abdominal   Peds  Hematology  (+) anemia ,   Anesthesia Other Findings   Reproductive/Obstetrics                            Anesthesia Physical Anesthesia Plan  ASA: 2  Anesthesia Plan: General   Post-op Pain Management:    Induction: Intravenous  PONV Risk Score and Plan: Propofol infusion  Airway Management Planned: Nasal Cannula and Natural Airway  Additional Equipment:   Intra-op Plan:   Post-operative Plan:   Informed Consent: I have reviewed the patients History and Physical, chart, labs and discussed the procedure including the risks, benefits and alternatives for the proposed anesthesia with the patient or authorized representative who has indicated his/her understanding and acceptance.     Dental advisory given  Plan Discussed with: CRNA and Surgeon  Anesthesia Plan Comments:         Anesthesia Quick Evaluation

## 2021-02-02 NOTE — H&P (Signed)
@LOGO @   Primary Care Physician:  , MD Primary Gastroenterologist:  Dr. Toma Deiters  Pre-Procedure History & Physical: HPI:  Karina Prince is a 50 y.o. female here for first ever high rescreening colonoscopy.  Mother with colon polyps at a young age.  Maternal grandmother with colon cancer in her 31s.  Chronic iron deficiency anemia status post GOP.  Patient without upper GI tract symptoms.  Celiac screen negative.  Past Medical History:  Diagnosis Date   Anemia    Anxiety    Asthma    Diabetes mellitus without complication (HCC)    Hypertension    Insomnia    MVP (mitral valve prolapse)    Trigeminal neuralgia     Past Surgical History:  Procedure Laterality Date   APPENDECTOMY     CHOLECYSTECTOMY  2005   dilitation and curretage     GASTRIC BYPASS  07/2014   TONSILLECTOMY     TUBAL LIGATION     VENTRAL HERNIA REPAIR  02/2015    Prior to Admission medications   Medication Sig Start Date End Date Taking? Authorizing Provider  benazepril (LOTENSIN) 5 MG tablet Take 5 mg by mouth daily. 09/14/20  Yes [provider]  BIOTIN PO Take 10,000 mcg by mouth daily.   Yes [provider]  CALCIUM PO Take 1 capsule by mouth daily.   Yes [provider]  clonazePAM (KLONOPIN) 0.5 MG tablet Take 0.5 mg by mouth at bedtime. 01/14/21  Yes [provider]  Cyanocobalamin (B-12 PO) Take by mouth daily. B12 inhalation   Yes [provider]  dicyclomine (BENTYL) 10 MG capsule Take 1 capsule (10 mg total) by mouth once every morning before breakfast. May increase up to 3 times daily before meals and at bedtime as needed for loose stools and abdominal cramping. Hold in the setting of constipation. Patient taking differently: Take 10 mg by mouth See admin instructions. Take 1 capsule (10 mg total) by mouth once every morning before breakfast. May increase up to 3 times daily before meals and at bedtime as needed for loose stools and abdominal  cramping. Hold in the setting of constipation. 11/08/20  Yes 01/08/21 S, PA-C  gabapentin (NEURONTIN) 300 MG capsule Take 300 mg by mouth at bedtime. 05/25/19  Yes [provider]  Glucosamine-Chondroitin (MOVE FREE PO) Take 1 capsule by mouth daily.   Yes [provider]  Multiple Vitamin (MULTIVITAMIN) tablet Take 1 tablet by mouth daily.   Yes [provider]  polyethylene glycol-electrolytes (TRILYTE) 420 g solution Take 4,000 mLs by mouth as directed. 01/02/21  Yes Presley Gora, 03/04/21, MD  traZODone (DESYREL) 100 MG tablet Take 100 mg by mouth at bedtime. 03/18/19  Yes [provider]  venlafaxine (EFFEXOR) 75 MG tablet Take 75 mg by mouth daily. 01/15/19  Yes [provider]    Allergies as of 01/02/2021 - Review Complete 12/28/2020  Allergen Reaction Noted   Adhesive [tape]  07/16/2013   Erythromycin  07/16/2013    Family History  Problem Relation Age of Onset   Colon polyps Mother        31s   Cataracts Mother    Osteoarthritis Mother    Mitral valve prolapse Mother    Colon cancer Maternal Grandmother        50s   Hypertension Sister    Hypertension Brother    Breast cancer Maternal Aunt    Lung cancer Maternal Grandfather    Hypertension Son  Hypertension Daughter    Rheum arthritis Daughter    Migraines Daughter     Social History   Socioeconomic History   Marital status: Married    Spouse name: Not on file   Number of children: 4   Years of education: Not on file   Highest education level: Not on file  Occupational History   Not on file  Tobacco Use   Smoking status: Former    Pack years: 0.00   Smokeless tobacco: Never  Substance and Sexual Activity   Alcohol use: No   Drug use: No   Sexual activity: Yes    Birth control/protection: Surgical  Other Topics Concern   Not on file  Social History Narrative   Not on file   Social Determinants of Health   Financial Resource Strain: Medium Risk    Difficulty of Paying Living Expenses: Somewhat hard  Food Insecurity: No Food Insecurity   Worried About Programme researcher, broadcasting/film/video in the Last Year: Never true   Ran Out of Food in the Last Year: Never true  Transportation Needs: No Transportation Needs   Lack of Transportation (Medical): No   Lack of Transportation (Non-Medical): No  Physical Activity: Inactive   Days of Exercise per Week: 0 days   Minutes of Exercise per Session: 0 min  Stress: Stress Concern Present   Feeling of Stress : To some extent  Social Connections: Press photographer of Communication with Friends and Family: More than three times a week   Frequency of Social Gatherings with Friends and Family: Once a week   Attends Religious Services: More than 4 times per year   Active Member of [employer] or Organizations: Yes   Attends Engineer, structural: More than 4 times per year   Marital Status: Married  Catering manager Violence: Not At Risk   Fear of Current or Ex-Partner: No   Emotionally Abused: No   Physically Abused: No   Sexually Abused: No    Review of Systems: See HPI, otherwise negative ROS  Physical Exam: BP 117/85   Temp 97.6 F (36.4 C) (Oral)   Resp 14   Ht 5\' 8"  (1.727 m)   Wt 86.2 kg   LMP 06/20/2013   SpO2 99%   BMI 28.89 kg/m  General:   Alert,  Well-developed, well-nourished, pleasant and cooperative in NAD Neck:  Supple; no masses or thyromegaly. No significant cervical adenopathy. Lungs:  Clear throughout to auscultation.   No wheezes, crackles, or rhonchi. No acute distress. Heart:  Regular rate and rhythm; no murmurs, clicks, rubs,  or gallops. Abdomen: Non-distended, normal bowel sounds.  Soft and nontender without appreciable mass or hepatosplenomegaly.  Pulses:  Normal pulses noted. Extremities:  Without clubbing or edema.  Impression/Plan: 50 year old lady here for first ever high risk screening colonoscopy.  We will perform EGD if colonoscopy is negative due to  iron deficiency anemia.  She has no GI symptoms globally. The risks, benefits, limitations, imponderables and alternatives regarding both EGD and colonoscopy have been reviewed with the patient. Questions have been answered. All parties agreeable.      Notice: This dictation was prepared with Dragon dictation along with smaller phrase technology. Any transcriptional errors that result from this process are unintentional and may not be corrected upon review.

## 2021-02-03 ENCOUNTER — Inpatient Hospital Stay (HOSPITAL_COMMUNITY): Payer: 59 | Attending: Hematology

## 2021-02-03 VITALS — BP 105/69 | HR 66 | Temp 98.0°F | Resp 18

## 2021-02-03 DIAGNOSIS — Z9884 Bariatric surgery status: Secondary | ICD-10-CM | POA: Diagnosis not present

## 2021-02-03 DIAGNOSIS — D509 Iron deficiency anemia, unspecified: Secondary | ICD-10-CM | POA: Diagnosis not present

## 2021-02-03 LAB — SURGICAL PATHOLOGY

## 2021-02-03 MED ORDER — FAMOTIDINE 20 MG IN NS 100 ML IVPB
20.0000 mg | Freq: Once | INTRAVENOUS | Status: AC
Start: 2021-02-03 — End: 2021-02-03
  Administered 2021-02-03: 20 mg via INTRAVENOUS
  Filled 2021-02-03: qty 20

## 2021-02-03 MED ORDER — METHYLPREDNISOLONE SODIUM SUCC 125 MG IJ SOLR
INTRAMUSCULAR | Status: AC
  Filled 2021-02-03: qty 2

## 2021-02-03 MED ORDER — SODIUM CHLORIDE 0.9 % IV SOLN
Freq: Once | INTRAVENOUS | Status: AC
Start: 2021-02-03 — End: 2021-02-03

## 2021-02-03 MED ORDER — DIPHENHYDRAMINE HCL 25 MG PO CAPS
50.0000 mg | ORAL_CAPSULE | Freq: Once | ORAL | Status: AC
  Administered 2021-02-03: 50 mg via ORAL

## 2021-02-03 MED ORDER — METHYLPREDNISOLONE SODIUM SUCC 125 MG IJ SOLR
125.0000 mg | Freq: Once | INTRAMUSCULAR | Status: AC
  Administered 2021-02-03: 125 mg via INTRAVENOUS

## 2021-02-03 MED ORDER — DIPHENHYDRAMINE HCL 25 MG PO CAPS
ORAL_CAPSULE | ORAL | Status: AC
  Filled 2021-02-03: qty 2

## 2021-02-03 MED ORDER — SODIUM CHLORIDE 0.9 % IV SOLN
300.0000 mg | Freq: Once | INTRAVENOUS | Status: AC
  Administered 2021-02-03: 300 mg via INTRAVENOUS
  Filled 2021-02-03: qty 300

## 2021-02-03 NOTE — Patient Instructions (Signed)
[facility]  Discharge Instructions: Thank you for choosing [facility] to provide your oncology and hematology care.  If you have a lab appointment with the Cancer Center, please come in thru the Main Entrance and check in at the main information desk.  Wear comfortable clothing and clothing appropriate for easy access to any Portacath or PICC line.   We strive to give you quality time with your provider. You may need to reschedule your appointment if you arrive late (15 or more minutes).  Arriving late affects you and other patients whose appointments are after yours.  Also, if you miss three or more appointments without notifying the office, you may be dismissed from the clinic at the provider's discretion.      For prescription refill requests, have your pharmacy contact our office and allow 72 hours for refills to be completed.    Today you received the following chemotherapy and/or immunotherapy agents Venofer infusion      To help prevent nausea and vomiting after your treatment, we encourage you to take your nausea medication as directed.  BELOW ARE SYMPTOMS THAT SHOULD BE REPORTED IMMEDIATELY: *FEVER GREATER THAN 100.4 F (38 C) OR HIGHER *CHILLS OR SWEATING *NAUSEA AND VOMITING THAT IS NOT CONTROLLED WITH YOUR NAUSEA MEDICATION *UNUSUAL SHORTNESS OF BREATH *UNUSUAL BRUISING OR BLEEDING *URINARY PROBLEMS (pain or burning when urinating, or frequent urination) *BOWEL PROBLEMS (unusual diarrhea, constipation, pain near the anus) TENDERNESS IN MOUTH AND THROAT WITH OR WITHOUT PRESENCE OF ULCERS (sore throat, sores in mouth, or a toothache) UNUSUAL RASH, SWELLING OR PAIN  UNUSUAL VAGINAL DISCHARGE OR ITCHING   Items with * indicate a potential emergency and should be followed up as soon as possible or go to the Emergency Department if any problems should occur.  Please show the CHEMOTHERAPY ALERT CARD or IMMUNOTHERAPY ALERT CARD at check-in to the Emergency  Department and triage nurse.  Should you have questions after your visit or need to cancel or reschedule your appointment, please contact [facility] [phone]  and follow the prompts.  Office hours are 8:00 a.m. to 4:30 p.m. Monday - Friday. Please note that voicemails left after 4:00 p.m. may not be returned until the following business day.  We are closed weekends and major holidays. You have access to a nurse at all times for urgent questions. Please call the main number to the clinic [phone] and follow the prompts.  For any non-urgent questions, you may also contact your provider using MyChart. We now offer e-Visits for anyone 18 and older to request care online for non-urgent symptoms. For details visit [email or web address].   Also download the MyChart app! Go to the app store, search "MyChart", open the app, select [facility], and log in with your MyChart username and password.  Due to Covid, a mask is required upon entering the hospital/clinic. If you do not have a mask, one will be given to you upon arrival. For doctor visits, patients may have 1 support person aged 18 or older with them. For treatment visits, patients cannot have anyone with them due to current Covid guidelines and our immunocompromised population.  

## 2021-02-03 NOTE — Progress Notes (Signed)
Patient presents today for Venofer infusion per providers order.  Vital signs WNL.  Patient has no new complaints since last visit.  Peripheral IV started and blood return noted pre and post infusion.  Venofer infusion given today per MD orders.  Stable during infusion without adverse affects.  Vital signs stable.  No complaints at this time.  Discharge from clinic ambulatory in stable condition.  Alert and oriented X 3.  Follow up with [facility] as scheduled.  

## 2021-02-04 ENCOUNTER — Encounter: Payer: Self-pay | Admitting: Internal Medicine

## 2021-02-09 ENCOUNTER — Encounter (HOSPITAL_COMMUNITY): Payer: Self-pay | Admitting: Internal Medicine

## 2021-02-10 ENCOUNTER — Ambulatory Visit (HOSPITAL_COMMUNITY): Payer: 59

## 2021-02-14 ENCOUNTER — Other Ambulatory Visit: Payer: Self-pay

## 2021-02-14 ENCOUNTER — Inpatient Hospital Stay (HOSPITAL_COMMUNITY): Payer: 59

## 2021-02-14 VITALS — BP 108/71 | HR 69 | Temp 97.0°F | Resp 18

## 2021-02-14 DIAGNOSIS — D509 Iron deficiency anemia, unspecified: Secondary | ICD-10-CM | POA: Diagnosis not present

## 2021-02-14 MED ORDER — DIPHENHYDRAMINE HCL 25 MG PO CAPS
50.0000 mg | ORAL_CAPSULE | Freq: Once | ORAL | Status: AC
  Administered 2021-02-14: 50 mg via ORAL

## 2021-02-14 MED ORDER — SODIUM CHLORIDE 0.9 % IV SOLN: Freq: Once | INTRAVENOUS | Status: AC

## 2021-02-14 MED ORDER — FAMOTIDINE 20 MG IN NS 100 ML IVPB
20.0000 mg | Freq: Once | INTRAVENOUS | Status: AC
  Administered 2021-02-14: 20 mg via INTRAVENOUS
  Filled 2021-02-14: qty 20

## 2021-02-14 MED ORDER — METHYLPREDNISOLONE SODIUM SUCC 125 MG IJ SOLR
125.0000 mg | Freq: Once | INTRAMUSCULAR | Status: AC
  Administered 2021-02-14: 125 mg via INTRAVENOUS

## 2021-02-14 MED ORDER — METHYLPREDNISOLONE SODIUM SUCC 125 MG IJ SOLR
INTRAMUSCULAR | Status: AC
  Filled 2021-02-14: qty 2

## 2021-02-14 MED ORDER — DIPHENHYDRAMINE HCL 25 MG PO CAPS
ORAL_CAPSULE | ORAL | Status: AC
  Filled 2021-02-14: qty 2

## 2021-02-14 MED ORDER — SODIUM CHLORIDE 0.9 % IV SOLN
300.0000 mg | Freq: Once | INTRAVENOUS | Status: AC
  Administered 2021-02-14: 300 mg via INTRAVENOUS
  Filled 2021-02-14: qty 300

## 2021-02-14 NOTE — Patient Instructions (Signed)
[facility]  Discharge Instructions: Thank you for choosing [facility] to provide your oncology and hematology care.  If you have a lab appointment with the Cancer Center, please come in thru the Main Entrance and check in at the main information desk.  Wear comfortable clothing and clothing appropriate for easy access to any Portacath or PICC line.   We strive to give you quality time with your provider. You may need to reschedule your appointment if you arrive late (15 or more minutes).  Arriving late affects you and other patients whose appointments are after yours.  Also, if you miss three or more appointments without notifying the office, you may be dismissed from the clinic at the provider's discretion.      For prescription refill requests, have your pharmacy contact our office and allow 72 hours for refills to be completed.    Today you received the following chemotherapy and/or immunotherapy agents Venofer      To help prevent nausea and vomiting after your treatment, we encourage you to take your nausea medication as directed.  BELOW ARE SYMPTOMS THAT SHOULD BE REPORTED IMMEDIATELY: *FEVER GREATER THAN 100.4 F (38 C) OR HIGHER *CHILLS OR SWEATING *NAUSEA AND VOMITING THAT IS NOT CONTROLLED WITH YOUR NAUSEA MEDICATION *UNUSUAL SHORTNESS OF BREATH *UNUSUAL BRUISING OR BLEEDING *URINARY PROBLEMS (pain or burning when urinating, or frequent urination) *BOWEL PROBLEMS (unusual diarrhea, constipation, pain near the anus) TENDERNESS IN MOUTH AND THROAT WITH OR WITHOUT PRESENCE OF ULCERS (sore throat, sores in mouth, or a toothache) UNUSUAL RASH, SWELLING OR PAIN  UNUSUAL VAGINAL DISCHARGE OR ITCHING   Items with * indicate a potential emergency and should be followed up as soon as possible or go to the Emergency Department if any problems should occur.  Please show the CHEMOTHERAPY ALERT CARD or IMMUNOTHERAPY ALERT CARD at check-in to the Emergency  Department and triage nurse.  Should you have questions after your visit or need to cancel or reschedule your appointment, please contact [facility] [phone]  and follow the prompts.  Office hours are 8:00 a.m. to 4:30 p.m. Monday - Friday. Please note that voicemails left after 4:00 p.m. may not be returned until the following business day.  We are closed weekends and major holidays. You have access to a nurse at all times for urgent questions. Please call the main number to the clinic [phone] and follow the prompts.  For any non-urgent questions, you may also contact your provider using MyChart. We now offer e-Visits for anyone 18 and older to request care online for non-urgent symptoms. For details visit [email or web address].   Also download the MyChart app! Go to the app store, search "MyChart", open the app, select [facility], and log in with your MyChart username and password.  Due to Covid, a mask is required upon entering the hospital/clinic. If you do not have a mask, one will be given to you upon arrival. For doctor visits, patients may have 1 support person aged 18 or older with them. For treatment visits, patients cannot have anyone with them due to current Covid guidelines and our immunocompromised population.  

## 2021-02-14 NOTE — Progress Notes (Signed)
Patient presents today for Venofer infusion per providers order.  Vital signs WNL.  Patient has no new complaints since last visit.  Peripheral IV started and blood return noted pre and post infusion.  Venofer infusion given today per MD orders.  Stable during infusion without adverse affects.  Vital signs stable.  No complaints at this time.  Discharge from clinic ambulatory in stable condition.  Alert and oriented X 3.  Follow up with [facility] as scheduled.

## 2021-06-07 NOTE — Progress Notes (Signed)
Referring Provider: Toma Deiters, MD Primary Care Physician:  Toma Deiters, MD Primary GI Physician: Dr. Jena Gauss  Chief Complaint  Patient presents with   Anemia    HPI:   Karina Prince is a 50 y.o. female presenting today for follow-up s/p colonoscopy and EGD which was pursued for colon cancer screening and anemia.  She was last seen in our office at the time of initial consult on 11/07/2020 at the request of PCP for colon cancer screening.  Noted patient had chronic history of anemia, on oral iron.  She denied overt GI bleeding or use of NSAIDs.  Noted history of gastric bypass and suspected this was likely contributing to decreased iron absorption though patient reported anemia predated gastric bypass.  She also reported chronic history of alternating constipation and diarrhea with diarrhea being predominant, up to 5-6 postprandial loose bowel movements with associated abdominal cramping that improves after a bowel movement.  No alarm symptoms.  No other significant GI symptoms.  Suspected loose stools were secondary to bile salt diarrhea and/or IBS-D.  Plan to screen for thyroid abnormalities, celiac disease, start Bentyl, and proceed with colonoscopy with possible EGD pending review of labs from PCP.  Received labs dated 09/19/2020.  No CBC included.   Labs completed 4/11 and 4/28: Hemoglobin 11.3 with microcytic indices, ferritin 5, iron saturation 5%, iron 19, TSH within  normal limits, celiac screen negative.  She was started on dicyclomine, plan to proceed with EGD and colonoscopy, and refer to hematology.  Procedures 02/02/2021: EGD: Normal esophagus, surgically altered stomach consistent with RYGB.  Abnormal anastomosis most likely benign, possibly related to postsurgical changes s/p biopsy. Pathology with hyperplastic gastric polyp, no H. pylori.  Colonoscopy: Entirely normal exam. Repeat in 5 years.   She has also established with hematology and has received 4 iron  infusions thus far. No follow-up labs. She has follow-up with hematology on 11/23.   Today:  Anemia: No brbpr or melena. Started iron patches daily. Feel better with this. Energy improved.  No unintentional weight loss.  Has good appetite.    Alternating constipation and diarrhea: Not really as much of an issue right now. Hasn't needed to use dicyclomine very much at all. Having 1 BM every couple of days. Stools are hard at times. Taking colace as needed which works well.  Currently satisfied with her bowel regimen.  No upper GI problems.  Denies nausea, vomiting, reflux symptoms, or dysphagia.  No NSAIDs.   Past Medical History:  Diagnosis Date   Anemia    Anxiety    Asthma    Diabetes mellitus without complication (HCC)    Hypertension    Insomnia    MVP (mitral valve prolapse)    Trigeminal neuralgia     Past Surgical History:  Procedure Laterality Date   APPENDECTOMY     BIOPSY  02/02/2021   Procedure: BIOPSY;  Surgeon: Corbin Ade, MD;  Location: [facility];  Service: Endoscopy;;  gastric   CHOLECYSTECTOMY  2005   COLONOSCOPY WITH PROPOFOL N/A 02/02/2021   Surgeon: Corbin Ade, MD;  Entirely normal exam. Repeat in 5 years.   dilitation and curretage     ESOPHAGOGASTRODUODENOSCOPY (EGD) WITH PROPOFOL N/A 02/02/2021   Surgeon: Corbin Ade, MD;Normal esophagus, surgically altered stomach consistent with RYBG.  Abnormal anastomosis most likely benign, possibly related to postsurgical changes s/p biopsy. Pathology with hyperplastic gastric polyp, no H. pylori.   GASTRIC BYPASS  07/2014   TONSILLECTOMY  TUBAL LIGATION     VENTRAL HERNIA REPAIR  02/2015    Current Outpatient Medications  Medication Sig Dispense Refill   benazepril (LOTENSIN) 5 MG tablet Take 5 mg by mouth daily.     BIOTIN PO Take 10,000 mcg by mouth daily.     CALCIUM PO Take 1 capsule by mouth daily.     clonazePAM (KLONOPIN) 0.5 MG tablet Take 0.5 mg by mouth at bedtime.      Cyanocobalamin (B-12 PO) Take by mouth daily. B12 inhalation     dicyclomine (BENTYL) 10 MG capsule Take 1 capsule (10 mg total) by mouth once every morning before breakfast. May increase up to 3 times daily before meals and at bedtime as needed for loose stools and abdominal cramping. Hold in the setting of constipation. (Patient taking differently: Take 10 mg by mouth See admin instructions. Take 1 capsule (10 mg total) by mouth once every morning before breakfast. May increase up to 3 times daily before meals and at bedtime as needed for loose stools and abdominal cramping. Hold in the setting of constipation.) 90 capsule 2   gabapentin (NEURONTIN) 300 MG capsule Take 300 mg by mouth at bedtime.     methocarbamol (ROBAXIN) 500 MG tablet Take 500 mg by mouth in the morning and at bedtime.     Multiple Vitamin (MULTIVITAMIN) tablet Take 1 tablet by mouth daily.     traZODone (DESYREL) 100 MG tablet Take 100 mg by mouth at bedtime.     venlafaxine (EFFEXOR) 75 MG tablet Take 75 mg by mouth daily.     No current facility-administered medications for this visit.    Allergies as of 06/08/2021 - Review Complete 06/08/2021  Allergen Reaction Noted   Shellfish allergy Anaphylaxis 02/02/2021   Adhesive [tape] Other (See Comments) 07/16/2013   Erythromycin Other (See Comments) 07/16/2013   Feraheme [ferumoxytol] Itching and Other (See Comments) 01/23/2021    Family History  Problem Relation Age of Onset   Colon polyps Mother        93s   Cataracts Mother    Osteoarthritis Mother    Mitral valve prolapse Mother    Colon cancer Maternal Grandmother        40s   Hypertension Sister    Hypertension Brother    Breast cancer Maternal Aunt    Lung cancer Maternal Grandfather    Hypertension Son    Hypertension Daughter    Rheum arthritis Daughter    Migraines Daughter     Social History   Socioeconomic History   Marital status: Married    Spouse name: Not on file   Number of children: 4    Years of education: Not on file   Highest education level: Not on file  Occupational History   Not on file  Tobacco Use   Smoking status: Former   Smokeless tobacco: Never  Substance and Sexual Activity   Alcohol use: No   Drug use: No   Sexual activity: Yes    Birth control/protection: Surgical  Other Topics Concern   Not on file  Social History Narrative   Not on file   Social Determinants of Health   Financial Resource Strain: Medium Risk   Difficulty of Paying Living Expenses: Somewhat hard  Food Insecurity: No Food Insecurity   Worried About Running Out of Food in the Last Year: Never true   Ran Out of Food in the Last Year: Never true  Transportation Needs: No Transportation Needs   Lack of  Transportation (Medical): No   Lack of Transportation (Non-Medical): No  Physical Activity: Inactive   Days of Exercise per Week: 0 days   Minutes of Exercise per Session: 0 min  Stress: Stress Concern Present   Feeling of Stress : To some extent  Social Connections: Press photographer of Communication with Friends and Family: More than three times a week   Frequency of Social Gatherings with Friends and Family: Once a week   Attends Religious Services: More than 4 times per year   Active Member of [employer] or Organizations: Yes   Attends Engineer, structural: More than 4 times per year   Marital Status: Married    Review of Systems: Gen: Denies fever, chills, cold or flulike symptoms, presyncope, syncope. CV: Denies chest pain, palpitations. Resp: Denies dyspnea or cough. GI: See HPI Heme: See HPI  Physical Exam: BP 116/79   Pulse 70   Temp 97.7 F (36.5 C)   Ht 5\' 8"  (1.727 m)   Wt 197 lb 6.4 oz (89.5 kg)   LMP 06/20/2013   BMI 30.01 kg/m  General:   Alert and oriented. No distress noted. Pleasant and cooperative.  Head:  Normocephalic and atraumatic. Eyes:  Conjuctiva clear without scleral icterus. Heart:  S1, S2 present without murmurs  appreciated. Lungs:  Clear to auscultation bilaterally. No wheezes, rales, or rhonchi. No distress.  Abdomen:  +BS, soft, non-tender and non-distended. No rebound or guarding. No HSM or masses noted. Msk:  Symmetrical without gross deformities. Normal posture. Extremities:  Without edema. Neurologic:  Alert and  oriented x4 Psych:  Normal mood and affect.    Assessment:  50 year old female with history of Roux-en-Y gastric bypass, iron deficiency anemia, presenting today for follow-up s/p EGD and colonoscopy completed in July 2022.  EGD revealed normal esophagus, surgically altered stomach consistent with RYGB, abnormal anastomosis s/p biopsy (hyperplastic polyp, no H. pylori).  Colonoscopy was entirely normal with recommendations to repeat in 5 years due to family history of colon cancer and colon polyps.  She has established with hematology and has received 4 iron infusions thus far.  Clinically she is doing well.  Continues to deny any overt GI bleeding.  No NSAIDs.  No unintentional weight loss.  No significant GI symptoms.  Though I suspect her IDA is secondary to malabsorption in the setting of RYGB, we discussed completing GI evaluation and ruling out small bowel tumor as a contributing factor of iron deficiency anemia. Due to altered anatomy, we will move forward with CT enterography rather than capsule endoscopy.   Alternating constipation and diarrhea: History of alternating constipation and diarrhea, likely secondary to bile salt diarrhea s/p cholecystectomy and IBS.  Symptoms are currently well managed with Colace as needed for constipation and fairly rare use of dicyclomine for diarrhea/abdominal pain. No alarm symptoms.   Plan:  CT A/P enterography. Update CBC and iron panel.  Update creatinine for CT. Continue following with hematology for iron deficiency anemia. Continue to avoid NSAIDs. Continue dicyclomine as needed for diarrhea/abdominal cramping. Continue Colace as needed for  constipation.  May take 100 mg daily if needed. Add Benefiber or Metamucil daily to help with bowel regularity. Further recommendations to follow CT.   August 2022, PA-C [facility] 06/08/2021

## 2021-06-08 ENCOUNTER — Ambulatory Visit (INDEPENDENT_AMBULATORY_CARE_PROVIDER_SITE_OTHER): Payer: 59 | Admitting: Gastroenterology

## 2021-06-08 ENCOUNTER — Other Ambulatory Visit: Payer: Self-pay

## 2021-06-08 ENCOUNTER — Encounter: Payer: Self-pay | Admitting: Gastroenterology

## 2021-06-08 VITALS — BP 116/79 | HR 70 | Temp 97.7°F | Ht 68.0 in | Wt 197.4 lb

## 2021-06-08 DIAGNOSIS — D509 Iron deficiency anemia, unspecified: Secondary | ICD-10-CM | POA: Diagnosis not present

## 2021-06-08 DIAGNOSIS — R198 Other specified symptoms and signs involving the digestive system and abdomen: Secondary | ICD-10-CM | POA: Diagnosis not present

## 2021-06-08 NOTE — Patient Instructions (Signed)
Please have labs completed at [facility].  We will arrange for you to have a CT of your abdomen to look closely at your small intestine and rule out any other causes of iron deficiency anemia.  Continue following with hematology for iron deficiency anemia.  Continue to avoid all NSAID products including ibuprofen, Aleve, Advil, Goody powders, BC powders, naproxen, and anything that says "NSAID" on the package.  Continue using dicyclomine as needed for diarrhea/abdominal cramping.  Continue using Colace as needed for constipation.  You may take 1 gelcap daily if needed.  Add Benefiber or Metamucil daily to help with bowel regularity.  Further recommendations to follow CT.   It was great to see you today! Have a Happy Thanksgiving!  Ermalinda Memos, PA-C [facility]

## 2021-06-09 LAB — CBC WITH DIFFERENTIAL/PLATELET
Absolute Monocytes: 338 cells/uL (ref 200–950)
Basophils Absolute: 33 cells/uL (ref 0–200)
Basophils Relative: 0.5 %
Eosinophils Absolute: 98 cells/uL (ref 15–500)
Eosinophils Relative: 1.5 %
HCT: 42.2 % (ref 35.0–45.0)
Hemoglobin: 14.2 g/dL (ref 11.7–15.5)
Lymphs Abs: 1963 cells/uL (ref 850–3900)
MCH: 31.4 pg (ref 27.0–33.0)
MCHC: 33.6 g/dL (ref 32.0–36.0)
MCV: 93.4 fL (ref 80.0–100.0)
MPV: 11.7 fL (ref 7.5–12.5)
Monocytes Relative: 5.2 %
Neutro Abs: 4069 cells/uL (ref 1500–7800)
Neutrophils Relative %: 62.6 %
Platelets: 251 10*3/uL (ref 140–400)
RBC: 4.52 10*6/uL (ref 3.80–5.10)
RDW: 12.5 % (ref 11.0–15.0)
Total Lymphocyte: 30.2 %
WBC: 6.5 10*3/uL (ref 3.8–10.8)

## 2021-06-09 LAB — CREATININE WITH EST GFR
Creat: 0.66 mg/dL (ref 0.50–1.03)
eGFR: 106 mL/min/{1.73_m2} (ref >=60)

## 2021-06-09 LAB — IRON,TIBC AND FERRITIN PANEL
%SAT: 28 % (calc) (ref 16–45)
Ferritin: 66 ng/mL (ref 16–232)
Iron: 78 ug/dL (ref 45–160)
TIBC: 275 mcg/dL (calc) (ref 250–450)

## 2021-06-14 ENCOUNTER — Inpatient Hospital Stay (HOSPITAL_COMMUNITY): Payer: 59 | Attending: Hematology

## 2021-06-14 ENCOUNTER — Other Ambulatory Visit: Payer: Self-pay

## 2021-06-14 DIAGNOSIS — Z23 Encounter for immunization: Secondary | ICD-10-CM | POA: Insufficient documentation

## 2021-06-14 DIAGNOSIS — D509 Iron deficiency anemia, unspecified: Secondary | ICD-10-CM | POA: Insufficient documentation

## 2021-06-14 LAB — CBC WITH DIFFERENTIAL/PLATELET
Abs Immature Granulocytes: 0.04 10*3/uL (ref 0.00–0.07)
Basophils Absolute: 0 10*3/uL (ref 0.0–0.1)
Basophils Relative: 0 %
Eosinophils Absolute: 0.1 10*3/uL (ref 0.0–0.5)
Eosinophils Relative: 1 %
HCT: 41.9 % (ref 36.0–46.0)
Hemoglobin: 14.2 g/dL (ref 12.0–15.0)
Immature Granulocytes: 0 %
Lymphocytes Relative: 21 %
Lymphs Abs: 2.1 10*3/uL (ref 0.7–4.0)
MCH: 32.6 pg (ref 26.0–34.0)
MCHC: 33.9 g/dL (ref 30.0–36.0)
MCV: 96.3 fL (ref 80.0–100.0)
Monocytes Absolute: 0.5 10*3/uL (ref 0.1–1.0)
Monocytes Relative: 5 %
Neutro Abs: 7.2 10*3/uL (ref 1.7–7.7)
Neutrophils Relative %: 73 %
Platelets: 243 10*3/uL (ref 150–400)
RBC: 4.35 MIL/uL (ref 3.87–5.11)
RDW: 13.1 % (ref 11.5–15.5)
WBC: 10 10*3/uL (ref 4.0–10.5)
nRBC: 0 % (ref 0.0–0.2)

## 2021-06-14 LAB — IRON AND TIBC
Iron: 37 ug/dL (ref 28–170)
Saturation Ratios: 12 % (ref 10.4–31.8)
TIBC: 309 ug/dL (ref 250–450)
UIBC: 272 ug/dL

## 2021-06-14 LAB — FERRITIN: Ferritin: 63 ng/mL (ref 11–307)

## 2021-06-15 ENCOUNTER — Telehealth: Payer: Self-pay

## 2021-06-15 NOTE — Telephone Encounter (Signed)
PA for CT abd/pelvis entero submitted via [employer]. Auth# [id number]. Case in clinical review.

## 2021-06-16 NOTE — Telephone Encounter (Signed)
Received fax from [facility], CT case was withdrawn.  Called [employer], spoke to [place], unable to approve CT. Link was sent to my email to review case via [employer] website. Case edited and approved. Order ID: [id number], valid 06/16/21-07/15/21.

## 2021-06-20 NOTE — Progress Notes (Signed)
[facility] [address] [place], [place] [address]   CLINIC:  Medical Oncology/Hematology  PCP:  Toma DeitersHasanaj, Xaje A, MD [address] [place] [phone]   REASON FOR VISIT:  Follow-up for iron deficiency anemia  PRIOR THERAPY: Iron tablets  CURRENT THERAPY: IV iron as needed  INTERVAL HISTORY:  Ms. Allean FoundSamra 50 y.o. female returns for routine follow-up of her iron deficiency anemia.  She was last seen by Dr. Ellin SabaKatragadda on 12/15/2020.  At today's visit, she reports feeling fair.  No recent hospitalizations, surgeries, or changes in baseline health status.  She reports that she did not notice any improvement in her fatigue after her IV iron in May/June 2022.  Additionally, she is hesitant to try IV iron again due to the large cost of her coinsurance ($800).  She reports that she has started using transdermal patches for vitamin supplementation, including an iron patch that contains 15 mg of ferrous bisglycinate.  She reports that her energy has improved after she started using the patches.  Her pica symptoms (craving olives) have resolved.  She does continue to have some restless leg symptoms and mild fatigue with energy about 60 to 70%.  No chest pain, dyspnea on exertion, lightheadedness, or syncope.  No obvious bleeding such as epistaxis, hematemesis, hematochezia, or melena.  She has 60-70% energy and 100% appetite. She endorses that she is maintaining a stable weight.    REVIEW OF SYSTEMS:  Review of Systems  Constitutional:  Positive for fatigue. Negative for appetite change, chills, diaphoresis, fever and unexpected weight change.  HENT:   Negative for lump/mass and nosebleeds.   Eyes:  Negative for eye problems.  Respiratory:  Positive for cough (Recent viral URI). Negative for hemoptysis and shortness of breath.   Cardiovascular:  Negative for chest pain, leg swelling and palpitations.  Gastrointestinal:  Positive for constipation. Negative  for abdominal pain, blood in stool, diarrhea, nausea and vomiting.  Genitourinary:  Negative for hematuria.   Skin: Negative.   Neurological:  Negative for dizziness, headaches and light-headedness.  Hematological:  Does not bruise/bleed easily.  Psychiatric/Behavioral:  The patient is nervous/anxious.      PAST MEDICAL/SURGICAL HISTORY:  Past Medical History:  Diagnosis Date   Anemia    Anxiety    Asthma    Diabetes mellitus without complication (HCC)    Hypertension    Insomnia    MVP (mitral valve prolapse)    Trigeminal neuralgia    Past Surgical History:  Procedure Laterality Date   APPENDECTOMY     BIOPSY  02/02/2021   Procedure: BIOPSY;  Surgeon: Corbin Adeourk, Robert M, MD;  Location: [facility];  Service: Endoscopy;;  gastric   CHOLECYSTECTOMY  2005   COLONOSCOPY WITH PROPOFOL N/A 02/02/2021   Surgeon: Corbin Adeourk, Robert M, MD;  Entirely normal exam. Repeat in 5 years.   dilitation and curretage     ESOPHAGOGASTRODUODENOSCOPY (EGD) WITH PROPOFOL N/A 02/02/2021   Surgeon: Corbin Adeourk, Robert M, MD;Normal esophagus, surgically altered stomach consistent with RYBG.  Abnormal anastomosis most likely benign, possibly related to postsurgical changes s/p biopsy. Pathology with hyperplastic gastric polyp, no H. pylori.   GASTRIC BYPASS  07/2014   TONSILLECTOMY     TUBAL LIGATION     VENTRAL HERNIA REPAIR  02/2015     SOCIAL HISTORY:  Social History   Socioeconomic History   Marital status: Married    Spouse name: Not on file   Number of children: 4   Years  of education: Not on file   Highest education level: Not on file  Occupational History   Not on file  Tobacco Use   Smoking status: Former   Smokeless tobacco: Never  Substance and Sexual Activity   Alcohol use: No   Drug use: No   Sexual activity: Yes    Birth control/protection: Surgical  Other Topics Concern   Not on file  Social History Narrative   Not on file   Social Determinants of Health   Financial Resource  Strain: Medium Risk   Difficulty of Paying Living Expenses: Somewhat hard  Food Insecurity: No Food Insecurity   Worried About Running Out of Food in the Last Year: Never true   Ran Out of Food in the Last Year: Never true  Transportation Needs: No Transportation Needs   Lack of Transportation (Medical): No   Lack of Transportation (Non-Medical): No  Physical Activity: Inactive   Days of Exercise per Week: 0 days   Minutes of Exercise per Session: 0 min  Stress: Stress Concern Present   Feeling of Stress : To some extent  Social Connections: Engineer, building services of Communication with Friends and Family: More than three times a week   Frequency of Social Gatherings with Friends and Family: Once a week   Attends Religious Services: More than 4 times per year   Active Member of [employer] or Organizations: Yes   Attends Music therapist: More than 4 times per year   Marital Status: Married  Human resources officer Violence: Not At Risk   Fear of Current or Ex-Partner: No   Emotionally Abused: No   Physically Abused: No   Sexually Abused: No    FAMILY HISTORY:  Family History  Problem Relation Age of Onset   Colon polyps Mother        19s   Cataracts Mother    Osteoarthritis Mother    Mitral valve prolapse Mother    Colon cancer Maternal Grandmother        38s   Hypertension Sister    Hypertension Brother    Breast cancer Maternal Aunt    Lung cancer Maternal Grandfather    Hypertension Son    Hypertension Daughter    Rheum arthritis Daughter    Migraines Daughter     CURRENT MEDICATIONS:  Outpatient Encounter Medications as of 06/21/2021  Medication Sig   benazepril (LOTENSIN) 5 MG tablet Take 5 mg by mouth daily.   BIOTIN PO Take 10,000 mcg by mouth daily.   CALCIUM PO Take 1 capsule by mouth daily.   clonazePAM (KLONOPIN) 0.5 MG tablet Take 0.5 mg by mouth at bedtime.   Cyanocobalamin (B-12 PO) Take by mouth daily. B12 inhalation   dicyclomine  (BENTYL) 10 MG capsule Take 1 capsule (10 mg total) by mouth once every morning before breakfast. May increase up to 3 times daily before meals and at bedtime as needed for loose stools and abdominal cramping. Hold in the setting of constipation. (Patient taking differently: Take 10 mg by mouth See admin instructions. Take 1 capsule (10 mg total) by mouth once every morning before breakfast. May increase up to 3 times daily before meals and at bedtime as needed for loose stools and abdominal cramping. Hold in the setting of constipation.)   gabapentin (NEURONTIN) 300 MG capsule Take 300 mg by mouth at bedtime.   methocarbamol (ROBAXIN) 500 MG tablet Take 500 mg by mouth in the morning and at bedtime.   Multiple Vitamin (MULTIVITAMIN)  tablet Take 1 tablet by mouth daily.   traZODone (DESYREL) 100 MG tablet Take 100 mg by mouth at bedtime.   venlafaxine (EFFEXOR) 75 MG tablet Take 75 mg by mouth daily.   No facility-administered encounter medications on file as of 06/21/2021.    ALLERGIES:  Allergies  Allergen Reactions   Shellfish Allergy Anaphylaxis   Adhesive [Tape] Other (See Comments)    Blister   Erythromycin Other (See Comments)    Stomach cramping   Feraheme [Ferumoxytol] Itching and Other (See Comments)    Itching and sneezing, throat tightness     PHYSICAL EXAM:  ECOG PERFORMANCE STATUS: 1 - Symptomatic but completely ambulatory  There were no vitals filed for this visit. There were no vitals filed for this visit. Physical Exam Constitutional:      Appearance: Normal appearance.  HENT:     Head: Normocephalic and atraumatic.     Mouth/Throat:     Mouth: Mucous membranes are moist.  Eyes:     Extraocular Movements: Extraocular movements intact.     Pupils: Pupils are equal, round, and reactive to light.  Cardiovascular:     Rate and Rhythm: Normal rate and regular rhythm.     Pulses: Normal pulses.     Heart sounds: Normal heart sounds.  Pulmonary:     Effort:  Pulmonary effort is normal.     Breath sounds: Normal breath sounds.  Abdominal:     General: Bowel sounds are normal.     Palpations: Abdomen is soft.     Tenderness: There is no abdominal tenderness.  Musculoskeletal:        General: No swelling.     Right lower leg: No edema.     Left lower leg: No edema.  Lymphadenopathy:     Cervical: No cervical adenopathy.  Skin:    General: Skin is warm and dry.  Neurological:     General: No focal deficit present.     Mental Status: She is alert and oriented to person, place, and time.  Psychiatric:        Mood and Affect: Mood normal.        Behavior: Behavior normal.     LABORATORY DATA:  I have reviewed the labs as listed.  CBC    Component Value Date/Time   WBC 10.0 06/14/2021 0822   RBC 4.35 06/14/2021 0822   HGB 14.2 06/14/2021 0822   HGB 11.3 11/24/2020 1108   HCT 41.9 06/14/2021 0822   HCT 36.1 11/24/2020 1108   PLT 243 06/14/2021 0822   PLT 295 11/24/2020 1108   MCV 96.3 06/14/2021 0822   MCV 82 11/24/2020 1108   MCH 32.6 06/14/2021 0822   MCHC 33.9 06/14/2021 0822   RDW 13.1 06/14/2021 0822   RDW 15.6 (H) 11/24/2020 1108   LYMPHSABS 2.1 06/14/2021 0822   LYMPHSABS 2.3 11/24/2020 1108   MONOABS 0.5 06/14/2021 0822   EOSABS 0.1 06/14/2021 0822   EOSABS 0.1 11/24/2020 1108   BASOSABS 0.0 06/14/2021 0822   BASOSABS 0.0 11/24/2020 1108   CMP Latest Ref Rng & Units 06/08/2021  Creatinine 0.50 - 1.03 mg/dL 0.66    DIAGNOSTIC IMAGING:  I have independently reviewed the relevant imaging and discussed with the patient.  ASSESSMENT & PLAN: 1.   Iron deficiency anemia: - She had gastric bypass surgery 6 years ago at [facility].  She lost 100 pounds and gained about 7 pounds from her lowest weight. - She had been taking iron tablet daily since  gastric bypass surgery, but without improvement in iron levels.  She also takes sublingual B12 and vitamin D supplements. - She has now switched to transdermal patches for  vitamin supplementation, including iron patch containing 15 mg ferrous bisglycinate. - Essentially normal EGD and colonoscopy on 02/02/2021, no signs of blood loss noted - She denies any previous blood transfusion.  She had TAH 3 years ago for prolapsed uterus. -Her pica cravings for all lobes have resolved. - She received IV Feraheme x2 on 12/28/2020 and 01/23/2021, but without much improvement in symptoms and with large coinsurance ($800) - Drug reaction to Feraheme during infusion on 01/23/2021 with facial itching, throat itching, and chest tightness.  Improved with Solu-Medrol and Benadryl. - She received IV Venofer (with premedications) on 02/03/2021 and 02/14/2021, which she tolerated without adverse effect - No obvious bleeding such as epistaxis, hematemesis, hematochezia, or melena -Mild residual fatigue has improved after starting herself on transdermal vitamin patches - Most recent labs (06/14/2021): Hgb 14.2, ferritin 63, iron saturation 12% - PLAN: No IV iron at this time.  Patient would like to avoid this in the future if at all possible. - Continue vitamin patches.  We will check labs again in 6 months to make sure they are working.  2.  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 - No family history of anemia or thalassemia.  Maternal aunt had breast cancer.  Maternal great aunt had breast cancer.   PLAN SUMMARY & DISPOSITION: Labs in 6 months Office visit after labs  All questions were answered. The patient knows to call the clinic with any problems, questions or concerns.  Medical decision making: Low  Time spent on visit: I spent 20 minutes counseling the patient face to face. The total time spent in the appointment was 30 minutes and more than 50% was on counseling.   Carnella Guadalajara, PA-C  06/21/2021 4:44 PM

## 2021-06-21 ENCOUNTER — Inpatient Hospital Stay (HOSPITAL_COMMUNITY): Payer: 59

## 2021-06-21 ENCOUNTER — Inpatient Hospital Stay (HOSPITAL_BASED_OUTPATIENT_CLINIC_OR_DEPARTMENT_OTHER): Payer: 59 | Admitting: Physician Assistant

## 2021-06-21 ENCOUNTER — Other Ambulatory Visit: Payer: Self-pay

## 2021-06-21 VITALS — BP 103/74 | HR 68 | Resp 16 | Wt 194.9 lb

## 2021-06-21 DIAGNOSIS — Z23 Encounter for immunization: Secondary | ICD-10-CM

## 2021-06-21 DIAGNOSIS — D509 Iron deficiency anemia, unspecified: Secondary | ICD-10-CM

## 2021-06-21 MED ORDER — INFLUENZA VAC SPLIT QUAD 0.5 ML IM SUSY
0.5000 mL | PREFILLED_SYRINGE | Freq: Once | INTRAMUSCULAR | Status: AC
  Administered 2021-06-21: 0.5 mL via INTRAMUSCULAR
  Filled 2021-06-21: qty 0.5

## 2021-06-21 NOTE — Patient Instructions (Signed)
[facility]CER CENTER  Discharge Instructions: Thank you for choosing [facility] to provide your oncology and hematology care.  If you have a lab appointment with the Cancer Center, please come in thru the Main Entrance and check in at the main information desk.  Wear comfortable clothing and clothing appropriate for easy access to any Portacath or PICC line.   We strive to give you quality time with your provider. You may need to reschedule your appointment if you arrive late (15 or more minutes).  Arriving late affects you and other patients whose appointments are after yours.  Also, if you miss three or more appointments without notifying the office, you may be dismissed from the clinic at the provider's discretion.      For prescription refill requests, have your pharmacy contact our office and allow 72 hours for refills to be completed.    Today you received the flu injection.    BELOW ARE SYMPTOMS THAT SHOULD BE REPORTED IMMEDIATELY: *FEVER GREATER THAN 100.4 F (38 C) OR HIGHER *CHILLS OR SWEATING *NAUSEA AND VOMITING THAT IS NOT CONTROLLED WITH YOUR NAUSEA MEDICATION *UNUSUAL SHORTNESS OF BREATH *UNUSUAL BRUISING OR BLEEDING *URINARY PROBLEMS (pain or burning when urinating, or frequent urination) *BOWEL PROBLEMS (unusual diarrhea, constipation, pain near the anus) TENDERNESS IN MOUTH AND THROAT WITH OR WITHOUT PRESENCE OF ULCERS (sore throat, sores in mouth, or a toothache) UNUSUAL RASH, SWELLING OR PAIN  UNUSUAL VAGINAL DISCHARGE OR ITCHING   Items with * indicate a potential emergency and should be followed up as soon as possible or go to the Emergency Department if any problems should occur.  Please show the CHEMOTHERAPY ALERT CARD or IMMUNOTHERAPY ALERT CARD at check-in to the Emergency Department and triage nurse.  Should you have questions after your visit or need to cancel or reschedule your appointment, please contact [facility]  [phone]  and follow the prompts.  Office hours are 8:00 a.m. to 4:30 p.m. Monday - Friday. Please note that voicemails left after 4:00 p.m. may not be returned until the following business day.  We are closed weekends and major holidays. You have access to a nurse at all times for urgent questions. Please call the main number to the clinic [phone] and follow the prompts.  For any non-urgent questions, you may also contact your provider using MyChart. We now offer e-Visits for anyone 19 and older to request care online for non-urgent symptoms. For details visit [email or web address].   Also download the MyChart app! Go to the app store, search "MyChart", open the app, select [facility], and log in with your MyChart username and password.  Due to Covid, a mask is required upon entering the hospital/clinic. If you do not have a mask, one will be given to you upon arrival. For doctor visits, patients may have 1 support person aged 21 or older with them. For treatment visits, patients cannot have anyone with them due to current Covid guidelines and our immunocompromised population.

## 2021-06-21 NOTE — Progress Notes (Signed)
Karina Prince presents today for injection per the provider's orders.  Fluarix administration without incident; injection site WNL; see MAR for injection details.  Patient tolerated procedure well and without incident.  No questions or complaints noted at this time.

## 2021-06-21 NOTE — Patient Instructions (Signed)
[facility] at [facility] Discharge Instructions  You were seen today by Rojelio Brenner PA-C for your iron deficiency anemia, which is suspected to be secondary to malabsorption following gastric bypass surgery in the past.  Your blood levels looked great!  You are not anemic at this time.  Your iron levels are much better than they used to be, but are not quite at their goal level.  You do not need any IV iron at this time.  Continue to use your iron patches at home, and we will recheck your levels in 6 months to make sure that they are working.  LABS: Return in 6 months for repeat labs  OTHER TESTS: No other tests at this time  MEDICATIONS: Continue iron at home  FOLLOW-UP APPOINTMENT: Office visit in 6 months, after labs   Thank you for choosing [facility] at [facility] to provide your oncology and hematology care.  To afford each patient quality time with our provider, please arrive at least 15 minutes before your scheduled appointment time.   If you have a lab appointment with the Cancer Center please come in thru the Main Entrance and check in at the main information desk.  You need to re-schedule your appointment should you arrive 10 or more minutes late.  We strive to give you quality time with our providers, and arriving late affects you and other patients whose appointments are after yours.  Also, if you no show three or more times for appointments you may be dismissed from the clinic at the providers discretion.     Again, thank you for choosing [facility].  Our hope is that these requests will decrease the amount of time that you wait before being seen by our physicians.       _____________________________________________________________  Should you have questions after your visit to [facility], please contact our office at [phone] and follow the prompts.  Our office hours are 8:00 a.m. and 4:30  p.m. Monday - Friday.  Please note that voicemails left after 4:00 p.m. may not be returned until the following business day.  We are closed weekends and major holidays.  You do have access to a nurse 24-7, just call the main number to the clinic [phone] and do not press any options, hold on the line and a nurse will answer the phone.    For prescription refill requests, have your pharmacy contact our office and allow 72 hours.    Due to Covid, you will need to wear a mask upon entering the hospital. If you do not have a mask, a mask will be given to you at the Main Entrance upon arrival. For doctor visits, patients may have 1 support person age 57 or older with them. For treatment visits, patients can not have anyone with them due to social distancing guidelines and our immunocompromised population.

## 2021-06-28 ENCOUNTER — Encounter: Payer: Self-pay | Admitting: Obstetrics & Gynecology

## 2021-06-28 ENCOUNTER — Other Ambulatory Visit: Payer: Self-pay

## 2021-06-28 ENCOUNTER — Ambulatory Visit (INDEPENDENT_AMBULATORY_CARE_PROVIDER_SITE_OTHER): Payer: 59 | Admitting: Obstetrics & Gynecology

## 2021-06-28 VITALS — Ht 68.0 in | Wt 193.2 lb

## 2021-06-28 DIAGNOSIS — Z01419 Encounter for gynecological examination (general) (routine) without abnormal findings: Secondary | ICD-10-CM | POA: Diagnosis not present

## 2021-06-28 DIAGNOSIS — Z1231 Encounter for screening mammogram for malignant neoplasm of breast: Secondary | ICD-10-CM | POA: Diagnosis not present

## 2021-06-28 DIAGNOSIS — N941 Unspecified dyspareunia: Secondary | ICD-10-CM

## 2021-06-28 DIAGNOSIS — N952 Postmenopausal atrophic vaginitis: Secondary | ICD-10-CM

## 2021-06-28 MED ORDER — ESTRADIOL 0.1 MG/GM VA CREA: 0.5000 g | TOPICAL_CREAM | Freq: Every day | VAGINAL | 12 refills | Status: AC

## 2021-06-28 NOTE — Patient Instructions (Signed)
Please schedule a mammogram at one of the following locations:  [place]: [phone]  [facility] [place]:[phone] [address]  

## 2021-06-28 NOTE — Progress Notes (Signed)
WELL-WOMAN EXAMINATION Patient name: Karina Prince MRN [id number]  Date of birth: 1971/07/14 Chief Complaint:   Gynecologic Exam  History of Present Illness:   NASTASHIA GALLO is a 50 y.o. [id number] PH, PM female being seen today for a routine well-woman exam.    Initially pt thought she had cervix; however, hysterectomy completed vaginally due to prolapse.    Dyspareunia:  She does report dyspareunia and vaginal dryness.  Not currently taking medication.  Denies vaginal discharge, itching or irritation. Denies pelvic or abdominal pain outside of IC.  On occasion will note mild hot flash/night sweat- not bothersome.  Previously noted some stress incontinence- improved s/p hyst/cystocele repair and weight loss.   Patient's last menstrual period was 06/20/2013.  Last pap no longer applicable.  Last mammogram: needs to schedule. Last colonoscopy: 2022  Depression screen [facility] 2/9 06/28/2021 12/14/2020  Decreased Interest 0 0  Down, Depressed, Hopeless 0 0  PHQ - 2 Score 0 0  Altered sleeping 1 -  Tired, decreased energy 1 -  Change in appetite 0 -  Feeling bad or failure about yourself  0 -  Trouble concentrating 2 -  Moving slowly or fidgety/restless 0 -  Suicidal thoughts 0 -  PHQ-9 Score 4 -     Review of Systems:   Pertinent items are noted in HPI Denies any headaches, blurred vision, fatigue, shortness of breath, chest pain, abdominal pain, bowel movements, urination unless otherwise stated above.  Pertinent History Reviewed:  Reviewed past medical,surgical, social and family history.  Reviewed problem list, medications and allergies. Physical Assessment:   Vitals:   06/28/21 1443  Weight: 193 lb 3.2 oz (87.6 kg)  Height: 5\' 8"  (1.727 m)  Body mass index is 29.38 kg/m.        Physical Examination:   General appearance - well appearing, and in no distress  Mental status - alert, oriented to person, place, and time  Psych:  She has a normal mood and  affect  Skin - warm and dry, normal color, no suspicious lesions noted  Chest - effort normal, all lung fields clear to auscultation bilaterally  Heart - normal rate and regular rhythm  Neck:  midline trachea, no thyromegaly or nodules  Breasts - breasts appear normal, no suspicious masses, no skin or nipple changes or  axillary nodes  Abdomen - soft, nontender, nondistended, no masses or organomegaly  Pelvic - VULVA: normal appearing vulva with no masses, tenderness or lesions  VAGINA:flat appearing pink mucosa with minimal loss of rugae, no lesions Uterus and cervix surgically absent    ADNEXA: No adnexal masses or tenderness noted.  Rectal - moderate external hemorrhoids noted  Extremities:  No swelling or varicosities noted  Chaperone:     Assessment & Plan:  1) Well-Woman Exam -pap no longer indicated -mammogram ordered  2) Dyspareunia/vaginal atrophy -discussed HRT and systemic vs topical estrogen therapy -discussed risk/benefit and WHI study -start estrogen cream- nightly x 2wks then twice per week -f/u in 72mos to re-evaluate  Orders Placed This Encounter  Procedures   MM 3D SCREEN BREAST BILATERAL    Meds:  Meds ordered this encounter  Medications   estradiol (ESTRACE VAGINAL) 0.1 MG/GM vaginal cream    Sig: Place 0.5 g vaginally at bedtime. Pea-sized amount- up to the first line on the applicator    Dispense:  42.5 g    Refill:  12    Follow-up: Return in about 3 months (around 09/26/2021) for Medication follow  up- ok for televisit if desird.   Myna Hidalgo, DO Attending Obstetrician & Gynecologist, [facility] for [employer], [facility]

## 2021-07-12 ENCOUNTER — Other Ambulatory Visit: Payer: Self-pay

## 2021-07-12 ENCOUNTER — Ambulatory Visit (HOSPITAL_COMMUNITY)
Admission: RE | Admit: 2021-07-12 | Discharge: 2021-07-12 | Disposition: A | Discharge status: Home / Self Care | Payer: 59 | Source: Ambulatory Visit | Attending: Gastroenterology | Admitting: Gastroenterology

## 2021-07-12 DIAGNOSIS — D509 Iron deficiency anemia, unspecified: Secondary | ICD-10-CM | POA: Insufficient documentation

## 2021-07-12 LAB — POCT I-STAT CREATININE: Creatinine, Ser: 0.7 mg/dL (ref 0.44–1.00)

## 2021-07-12 MED ORDER — IOHEXOL 300 MG/ML  SOLN
100.0000 mL | Freq: Once | INTRAMUSCULAR | Status: AC | PRN
  Administered 2021-07-12: 10:00:00 100 mL via INTRAVENOUS

## 2021-07-27 ENCOUNTER — Encounter (HOSPITAL_COMMUNITY): Payer: Self-pay | Admitting: Hematology

## 2021-09-19 ENCOUNTER — Encounter (HOSPITAL_COMMUNITY): Payer: Self-pay | Admitting: Hematology

## 2021-09-21 ENCOUNTER — Ambulatory Visit: Payer: 59 | Admitting: Obstetrics & Gynecology

## 2021-09-25 ENCOUNTER — Ambulatory Visit (HOSPITAL_COMMUNITY)
Admission: RE | Admit: 2021-09-25 | Discharge: 2021-09-25 | Disposition: A | Discharge status: Home / Self Care | Payer: 59 | Source: Ambulatory Visit | Attending: Internal Medicine | Admitting: Internal Medicine

## 2021-09-25 ENCOUNTER — Other Ambulatory Visit: Payer: Self-pay

## 2021-09-25 ENCOUNTER — Other Ambulatory Visit (HOSPITAL_COMMUNITY): Payer: Self-pay | Admitting: Internal Medicine

## 2021-09-25 DIAGNOSIS — M25562 Pain in left knee: Secondary | ICD-10-CM

## 2021-09-28 ENCOUNTER — Other Ambulatory Visit: Payer: Self-pay

## 2021-09-28 ENCOUNTER — Encounter: Payer: Self-pay | Admitting: Orthopaedic Surgery

## 2021-09-28 ENCOUNTER — Ambulatory Visit (INDEPENDENT_AMBULATORY_CARE_PROVIDER_SITE_OTHER): Payer: 59 | Admitting: Orthopaedic Surgery

## 2021-09-28 VITALS — Ht 68.0 in | Wt 190.0 lb

## 2021-09-28 DIAGNOSIS — M2242 Chondromalacia patellae, left knee: Secondary | ICD-10-CM | POA: Diagnosis not present

## 2021-09-28 NOTE — Progress Notes (Signed)
? ?Office Visit Note ?  ?Patient: Karina Prince           ?Date of Birth: [birth year]           ?MRN: [id number] ?Visit Date: 09/28/2021 ?             ?Requested by: Toma Deiters, MD ?[address] [address] ?[place],  [place] [address] ?PCP: Toma Deiters, MD ? ? ?Assessment & Plan: ?Visit Diagnoses:  ?1. Chondromalacia patellae, left knee   ? ? ?Plan: Patient has arthritis in her left knee.  She still is active and has not having enough symptoms at this point to proceed with total knee arthroplasty.  She can continue the diclofenac gel topically.  When she has increased symptoms and is not able to dissipate in regular activities she can return to discuss total knee arthroplasty.  Last x-rays still show she has some joint space left. ? ?Follow-Up Instructions: No follow-ups on file.  ? ?Orders:  ?No orders of the defined types were placed in this encounter. ? ?No orders of the defined types were placed in this encounter. ? ? ? ? Procedures: ?No procedures performed ? ? ?Clinical Data: ?No additional findings. ? ? ?Subjective: ?Chief Complaint  ?Patient presents with  ? Left Knee - Pain  ? ? ?HPI 51 year old female returns for follow-up with 2 years ago left knee MRI showing tricompartmental chondromalacia.  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  She has problems with the knee particularly with stairs.  Injection last year gave her minimal relief she has some popping.  Stiffness after being in a vehicle for prolonged time.  Knee has not locked pain radiates into her legs sometimes wakes her up.  No numbness or tingling in her feet.  Does not prevent her from going anywhere she can ambulate without limping today.  Patient has grandchildren and is more active than last year particularly with activities on the farm.  Patient had 1 injection by Dr.Hasanaj in her left knee and it gave her minimal improvement. ? ?Review of Systems all other systems updated unchanged from 06/11/2019  office visit. ? ? ?Objective: ?Vital Signs: Ht 5\' 8"  (1.727 m)   Wt 190 lb (86.2 kg)   LMP 06/20/2013   BMI 28.89 kg/m?  ? ?Physical Exam ?Constitutional:   ?   Appearance: She is well-developed.  ?HENT:  ?   Head: Normocephalic.  ?   Right Ear: External ear normal.  ?   Left Ear: External ear normal. There is no impacted cerumen.  ?Eyes:  ?   Pupils: Pupils are equal, round, and reactive to light.  ?Neck:  ?   Thyroid: No thyromegaly.  ?   Trachea: No tracheal deviation.  ?Cardiovascular:  ?   Rate and Rhythm: Normal rate.  ?Pulmonary:  ?   Effort: Pulmonary effort is normal.  ?Abdominal:  ?   Palpations: Abdomen is soft.  ?Musculoskeletal:  ?   Cervical back: No rigidity.  ?Skin: ?   General: Skin is warm and dry.  ?Neurological:  ?   Mental Status: She is alert and oriented to person, place, and time.  ?Psychiatric:     ?   Behavior: Behavior normal.  ? ? ?Ortho Exam mild crepitus with knee flexion extension no varus or valgus deformity collateral ligaments are stable ACL PCL exam is normal.  Negative logroll the hips negative straight leg raising negative popliteal compression test. ? ?Specialty  Comments:  ?No specialty comments available. ? ?Imaging: ?No results found. ? ? ?PMFS History: ?Patient Active Problem List  ? Diagnosis Date Noted  ? Alternating constipation and diarrhea 06/08/2021  ? Iron deficiency anemia 12/15/2020  ? Colon cancer screening 11/07/2020  ? Anemia 11/07/2020  ? Loose stools 11/07/2020  ? Chondromalacia patellae, left knee 06/11/2019  ? ?Past Medical History:  ?Diagnosis Date  ? Anemia   ? Anxiety   ? Asthma   ? Diabetes mellitus without complication (HCC)   ? Hypertension   ? Insomnia   ? MVP (mitral valve prolapse)   ? Trigeminal neuralgia   ?  ?Family History  ?Problem Relation Age of Onset  ? Colon polyps Mother   ?     64s  ? Cataracts Mother   ? Osteoarthritis Mother   ? Mitral valve prolapse Mother   ? Colon cancer Maternal Grandmother   ?     22s  ? Hypertension Sister   ?  Hypertension Brother   ? Breast cancer Maternal Aunt   ? Lung cancer Maternal Grandfather   ? Hypertension Son   ? Hypertension Daughter   ? Rheum arthritis Daughter   ? Migraines Daughter   ?  ?Past Surgical History:  ?Procedure Laterality Date  ? APPENDECTOMY    ? BIOPSY  02/02/2021  ? Procedure: BIOPSY;  Surgeon: Corbin Ade, MD;  Location: [facility];  Service: Endoscopy;;  gastric  ? CHOLECYSTECTOMY  2005  ? COLONOSCOPY WITH PROPOFOL N/A 02/02/2021  ? Surgeon: Corbin Ade, MD;  Entirely normal exam. Repeat in 5 years.  ? dilitation and curretage    ? ESOPHAGOGASTRODUODENOSCOPY (EGD) WITH PROPOFOL N/A 02/02/2021  ? Surgeon: Corbin Ade, MD;Normal esophagus, surgically altered stomach consistent with RYBG.  Abnormal anastomosis most likely benign, possibly related to postsurgical changes s/p biopsy. Pathology with hyperplastic gastric polyp, no H. pylori.  ? GASTRIC BYPASS  07/2014  ? TONSILLECTOMY    ? TUBAL LIGATION    ? VENTRAL HERNIA REPAIR  02/2015  ? ?Social History  ? ?Occupational History  ? Not on file  ?Tobacco Use  ? Smoking status: Former  ? Smokeless tobacco: Never  ?Vaping Use  ? Vaping Use: Never used  ?Substance and Sexual Activity  ? Alcohol use: No  ? Drug use: No  ? Sexual activity: Yes  ?  Birth control/protection: Surgical  ? ? ? ? ? ? ?

## 2021-10-21 ENCOUNTER — Emergency Department (HOSPITAL_COMMUNITY)
Admission: EM | Admit: 2021-10-21 | Discharge: 2021-10-21 | Disposition: A | Discharge status: Home / Self Care | Payer: 59 | Attending: Emergency Medicine | Admitting: Emergency Medicine

## 2021-10-21 ENCOUNTER — Emergency Department (HOSPITAL_COMMUNITY): Payer: 59

## 2021-10-21 ENCOUNTER — Encounter (HOSPITAL_COMMUNITY): Payer: Self-pay | Admitting: Hematology

## 2021-10-21 ENCOUNTER — Encounter (HOSPITAL_COMMUNITY): Payer: Self-pay | Admitting: Emergency Medicine

## 2021-10-21 ENCOUNTER — Other Ambulatory Visit: Payer: Self-pay

## 2021-10-21 DIAGNOSIS — S0990XA Unspecified injury of head, initial encounter: Secondary | ICD-10-CM | POA: Diagnosis present

## 2021-10-21 DIAGNOSIS — M542 Cervicalgia: Secondary | ICD-10-CM | POA: Diagnosis not present

## 2021-10-21 DIAGNOSIS — S0003XA Contusion of scalp, initial encounter: Secondary | ICD-10-CM | POA: Diagnosis not present

## 2021-10-21 DIAGNOSIS — W01198A Fall on same level from slipping, tripping and stumbling with subsequent striking against other object, initial encounter: Secondary | ICD-10-CM | POA: Insufficient documentation

## 2021-10-21 NOTE — ED Provider Notes (Signed)
? EMERGENCY DEPARTMENT ?Provider Note ? ? ?CSN: [id number] ?Arrival date & time: 10/21/21  1818 ? ?  ? ?History ? ?Chief Complaint  ?Patient presents with  ? Head Injury  ? ? ?Karina Prince is a 51 y.o. female. ? ? ?Head Injury ? ?Patient is a 51 year old female presenting today due to head injury.  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  1 fell backwards and hit her in the back of the head, she did not lose consciousness.  She is not having nausea but no vomiting.  Endorses feeling numb on one side of her face, also feeling "funny".  She not having any blurry vision, no vomiting, no other weakness.  She does endorse neck pain on both sides primarily with movement.  Not on blood thinners. ? ?Home Medications ?Prior to Admission medications   ?Medication Sig Start Date End Date Taking? Authorizing Provider  ?benazepril (LOTENSIN) 5 MG tablet Take 5 mg by mouth daily. 09/14/20   [provider]  ?BIOTIN PO Take 10,000 mcg by mouth daily.    [provider]  ?CALCIUM PO Take 1 capsule by mouth daily.    [provider]  ?clonazePAM (KLONOPIN) 0.5 MG tablet Take 0.5 mg by mouth at bedtime. 01/14/21   [provider]  ?Cyanocobalamin (B-12 PO) Take by mouth daily. B12 inhalation    [provider]  ?dicyclomine (BENTYL) 10 MG capsule Take 1 capsule (10 mg total) by mouth once every morning before breakfast. May increase up to 3 times daily before meals and at bedtime as needed for loose stools and abdominal cramping. Hold in the setting of constipation. ?Patient taking differently: Take 10 mg by mouth See admin instructions. Take 1 capsule (10 mg total) by mouth once every morning before breakfast. May increase up to 3 times daily before meals and at bedtime as needed for loose stools and abdominal cramping. Hold in the setting of constipation. 11/08/20   Letta Median, PA-C  ?estradiol (ESTRACE VAGINAL) 0.1 MG/GM vaginal cream Place 0.5  g vaginally at bedtime. Pea-sized amount- up to the first line on the applicator 06/28/21   Myna Hidalgo, DO  ?gabapentin (NEURONTIN) 300 MG capsule Take 300 mg by mouth at bedtime. 05/25/19   [provider]  ?methocarbamol (ROBAXIN) 500 MG tablet Take 500 mg by mouth in the morning and at bedtime.    [provider]  ?Multiple Vitamin (MULTIVITAMIN) tablet Take 1 tablet by mouth daily.    [provider]  ?traZODone (DESYREL) 100 MG tablet Take 100 mg by mouth at bedtime. 03/18/19   [provider]  ?venlafaxine (EFFEXOR) 75 MG tablet Take 75 mg by mouth daily. 01/15/19   [provider]  ?   ? ?Allergies    ?Shellfish allergy, Adhesive [tape], Erythromycin, and Feraheme [ferumoxytol]   ? ?Review of Systems   ?Review of Systems ? ?Physical Exam ?Updated Vital Signs ?BP 137/85 (BP Location: Right Arm)   Pulse (!) 54   Temp 98.3 ?F (36.8 ?C) (Oral)   Resp 14   Ht 5\' 8"  (1.727 m)   Wt 85.3 kg   LMP 06/20/2013   SpO2 100%   BMI 28.59 kg/m?  ?Physical Exam ?Vitals and nursing note reviewed. Exam conducted with a chaperone present.  ?Constitutional:   ?   Appearance: Normal appearance.  ?HENT:  ?   Head: Normocephalic.  ?   Comments: No periorbital ecchymosis.  Patient has hematoma to  the occiput.  No active bleeding or lacerations noted to the scalp ?   Right Ear: Tympanic membrane normal.  ?   Left Ear: Tympanic membrane normal.  ?   Ears:  ?   Comments: No auricular hematoma. No hemotympanum. Negative battle sign.  ?   Nose: Nose normal.  ?   Comments: No clear nasal drainage concerning for CSF leak. No septal hematoma. No nasal crepitus.  ?   Mouth/Throat:  ?   Mouth: Mucous membranes are moist.  ?   Pharynx: Oropharynx is clear.  ?   Comments: No malocclusion or dental fractures. No dental avulsions appreciated.  ?Eyes:  ?   Extraocular Movements: Extraocular movements intact.  ?   Pupils: Pupils are equal, round, and reactive to light.  ?   Comments: Negative  for hyphemia, normal shaped pupil. No restrictions of EOM or pain with EOMs.  ?Neck:  ?   Comments: Tenderness with ROM although ROM is fully intact.  There is no midline tenderness or palpable step-offs ?Musculoskeletal:  ?   Cervical back: Normal range of motion. Tenderness present.  ?Neurological:  ?   General: No focal deficit present.  ?   Mental Status: She is alert and oriented to person, place, and time.  ?   Comments: No facial numbness. The patient is alert and oriented to person, place, and time with normal speech. Cranial nerves III-XII are grossly intact.   ?Psychiatric:     ?   Mood and Affect: Mood normal.  ? ? ?ED Results / Procedures / Treatments   ?Labs ?(all labs ordered are listed, but only abnormal results are displayed) ?Labs Reviewed - No data to display ? ?EKG ?None ? ?Radiology ?CT Head Wo Contrast ? ?Result Date: 10/21/2021 ?CLINICAL DATA:  Status post trauma. EXAM: CT HEAD WITHOUT CONTRAST TECHNIQUE: Contiguous axial images were obtained from the base of the skull through the vertex without intravenous contrast. RADIATION DOSE REDUCTION: This exam was performed according to the departmental dose-optimization program which includes automated exposure control, adjustment of the mA and/or kV according to patient size and/or use of iterative reconstruction technique. COMPARISON:  None. FINDINGS: Brain: No evidence of acute infarction, hemorrhage, hydrocephalus, extra-axial collection or mass lesion/mass effect. Mild asymmetry of the size of the lateral ventricles is seen, right slightly greater than left. This is within normal limits and is likely chronic in nature. Vascular: No hyperdense vessel or unexpected calcification. Skull: Normal. Negative for fracture or focal lesion. Sinuses/Orbits: No acute finding. Other: Mild scalp soft tissue swelling is seen along the posterior aspect of the vertex on the right. IMPRESSION: 1. No acute intracranial pathology. 2. Mild scalp soft tissue swelling  along the posterior aspect of the vertex on the right, without evidence of an underlying fracture. Electronically Signed   By: Aram Candela M.D.   On: 10/21/2021 19:28  ? ?CT Cervical Spine Wo Contrast ? ?Result Date: 10/21/2021 ?CLINICAL DATA:  Status post trauma. EXAM: CT CERVICAL SPINE WITHOUT CONTRAST TECHNIQUE: Multidetector CT imaging of the cervical spine was performed without intravenous contrast. Multiplanar CT image reconstructions were also generated. RADIATION DOSE REDUCTION: This exam was performed according to the departmental dose-optimization program which includes automated exposure control, adjustment of the mA and/or kV according to patient size and/or use of iterative reconstruction technique. COMPARISON:  None. FINDINGS: Alignment: Normal. Skull base and vertebrae: No acute fracture. No primary bone lesion or focal pathologic process. Soft tissues and spinal canal: No prevertebral fluid or swelling. No  visible canal hematoma. Disc levels: Mild endplate sclerosis and anterior osteophyte formation are seen at the levels of C5-C6 and C6-C7. Mild intervertebral disc space narrowing is seen at C5-C6 and C6-C7. Mild, bilateral multilevel facet joint hypertrophy is noted. Upper chest: Negative. Other: None. IMPRESSION: 1. No acute fracture or subluxation in the cervical spine. 2. Mild degenerative changes at the levels of C5-C6 and C6-C7. Electronically Signed   By: Aram Candelahaddeus  Houston M.D.   On: 10/21/2021 19:24   ? ?Procedures ?Procedures  ? ? ?Medications Ordered in ED ?Medications - No data to display ? ?ED Course/ Medical Decision Making/ A&P ?  ?                        ?Medical Decision Making ?Amount and/or Complexity of Data Reviewed ?Radiology: ordered. ? ? ?This patient presents to the ED for concern of head injury, this involves an extensive number of treatment options, and is a complaint that carries with it a high risk of complications and morbidity.  The differential diagnosis includes  cancer skull fracture, intracranial bleed, C-spine injury, other ? ? ?Additional history obtained:  ? ?Independent historian: daughter ? ?  ?Imaging Studies ordered: ? ?I directly visualized the CT head and C-sp

## 2021-10-21 NOTE — ED Triage Notes (Addendum)
Patient hit in back of the head today with big heavy tent pole. Denies LOC but reports dizziness, headache, and blurred vision. Denies any nausea. Patient also states right sided face tingling/face spasms with neck pain. Denies taking any type of anticoagulant. ? ?

## 2021-10-21 NOTE — Discharge Instructions (Signed)
Rest your brain over the next couple days.  Take Tylenol as needed for pain.  Your CT scans were did not show any signs of bleeding or acute injury.  You may have some concussion symptoms, information attached to read about. ?

## 2021-12-12 ENCOUNTER — Inpatient Hospital Stay (HOSPITAL_COMMUNITY): Payer: 59 | Attending: Hematology

## 2021-12-19 ENCOUNTER — Ambulatory Visit (HOSPITAL_COMMUNITY): Payer: 59 | Admitting: Physician Assistant

## 2022-05-21 DIAGNOSIS — I10 Essential (primary) hypertension: Secondary | ICD-10-CM | POA: Diagnosis not present

## 2022-05-21 DIAGNOSIS — E1140 Type 2 diabetes mellitus with diabetic neuropathy, unspecified: Secondary | ICD-10-CM | POA: Diagnosis not present

## 2022-05-21 DIAGNOSIS — Z6828 Body mass index (BMI) 28.0-28.9, adult: Secondary | ICD-10-CM | POA: Diagnosis not present

## 2022-05-28 ENCOUNTER — Encounter (HOSPITAL_COMMUNITY): Payer: Self-pay | Admitting: Hematology

## 2022-05-29 ENCOUNTER — Encounter (HOSPITAL_COMMUNITY): Payer: Self-pay | Admitting: Hematology

## 2022-06-07 ENCOUNTER — Encounter: Payer: Self-pay | Admitting: Gastroenterology

## 2022-07-09 DIAGNOSIS — K529 Noninfective gastroenteritis and colitis, unspecified: Secondary | ICD-10-CM | POA: Diagnosis not present

## 2022-07-09 DIAGNOSIS — Z6828 Body mass index (BMI) 28.0-28.9, adult: Secondary | ICD-10-CM | POA: Diagnosis not present

## 2022-08-27 DIAGNOSIS — Z6829 Body mass index (BMI) 29.0-29.9, adult: Secondary | ICD-10-CM | POA: Diagnosis not present

## 2022-08-27 DIAGNOSIS — E1140 Type 2 diabetes mellitus with diabetic neuropathy, unspecified: Secondary | ICD-10-CM | POA: Diagnosis not present

## 2022-08-27 DIAGNOSIS — I10 Essential (primary) hypertension: Secondary | ICD-10-CM | POA: Diagnosis not present

## 2022-08-27 DIAGNOSIS — E7849 Other hyperlipidemia: Secondary | ICD-10-CM | POA: Diagnosis not present

## 2022-11-08 ENCOUNTER — Encounter (HOSPITAL_COMMUNITY): Payer: Self-pay

## 2022-11-08 ENCOUNTER — Encounter (HOSPITAL_COMMUNITY): Payer: Self-pay | Admitting: Hematology

## 2022-11-08 ENCOUNTER — Emergency Department (HOSPITAL_COMMUNITY)
Admission: EM | Admit: 2022-11-08 | Discharge: 2022-11-08 | Disposition: A | Discharge status: Home / Self Care | Payer: PRIVATE HEALTH INSURANCE | Attending: Emergency Medicine | Admitting: Emergency Medicine

## 2022-11-08 ENCOUNTER — Emergency Department (HOSPITAL_COMMUNITY): Payer: PRIVATE HEALTH INSURANCE

## 2022-11-08 DIAGNOSIS — R059 Cough, unspecified: Secondary | ICD-10-CM | POA: Insufficient documentation

## 2022-11-08 DIAGNOSIS — R079 Chest pain, unspecified: Secondary | ICD-10-CM | POA: Diagnosis present

## 2022-11-08 DIAGNOSIS — R072 Precordial pain: Secondary | ICD-10-CM | POA: Diagnosis not present

## 2022-11-08 LAB — CBC
HCT: 43.3 % (ref 36.0–46.0)
Hemoglobin: 14.6 g/dL (ref 12.0–15.0)
MCH: 31.1 pg (ref 26.0–34.0)
MCHC: 33.7 g/dL (ref 30.0–36.0)
MCV: 92.1 fL (ref 80.0–100.0)
Platelets: 194 10*3/uL (ref 150–400)
RBC: 4.7 MIL/uL (ref 3.87–5.11)
RDW: 12.7 % (ref 11.5–15.5)
WBC: 7 10*3/uL (ref 4.0–10.5)
nRBC: 0 % (ref 0.0–0.2)

## 2022-11-08 LAB — BASIC METABOLIC PANEL
Anion gap: 7 (ref 5–15)
BUN: 10 mg/dL (ref 6–20)
CO2: 23 mmol/L (ref 22–32)
Calcium: 10.1 mg/dL (ref 8.9–10.3)
Chloride: 108 mmol/L (ref 98–111)
Creatinine, Ser: 0.65 mg/dL (ref 0.44–1.00)
GFR, Estimated: >60 mL/min (ref >60)
Glucose, Bld: 97 mg/dL (ref 70–99)
Potassium: 4 mmol/L (ref 3.5–5.1)
Sodium: 138 mmol/L (ref 135–145)

## 2022-11-08 LAB — TROPONIN I (HIGH SENSITIVITY)
Troponin I (High Sensitivity): <2 ng/L (ref <18)
Troponin I (High Sensitivity): <2 ng/L (ref <18)

## 2022-11-08 MED ORDER — ACETAMINOPHEN 500 MG PO TABS
1000.0000 mg | ORAL_TABLET | Freq: Once | ORAL | Status: AC
  Administered 2022-11-08: 1000 mg via ORAL
  Filled 2022-11-08: qty 2

## 2022-11-08 MED ORDER — METHOCARBAMOL 750 MG PO TABS: 750.0000 mg | ORAL_TABLET | Freq: Three times a day (TID) | ORAL | 0 refills | Status: AC | PRN

## 2022-11-08 MED ORDER — IBUPROFEN 400 MG PO TABS
400.0000 mg | ORAL_TABLET | Freq: Once | ORAL | Status: DC
  Filled 2022-11-08: qty 1

## 2022-11-08 NOTE — ED Triage Notes (Signed)
Pt states she did yardwork, at beginning of week; beginning having sharp grabbing pain in chest; hx mitral valve prolapse, arrhythmias; tightness increasing in frequency, worse with bending at waist; also c/o L arm pain/pressure radiating into L face; also has trigeminal neuralgia of L face; chest congestion x 1 month endorses some sob; denies n/v

## 2022-11-08 NOTE — Discharge Instructions (Signed)
It was our pleasure to provide your ER care today - we hope that you feel better.  Take acetaminophen or ibuprofen as need. If muscle pain/spasm, may try robaxin as need - no driving when taking.   For recent chest pain, follow up with primary care doctor/cardiologist in the next 1-2 weeks.   Return to ER if worse, new symptoms, recurrent or persistent chest pain, increased trouble breathing, or other emergency concern.

## 2022-11-08 NOTE — ED Provider Triage Note (Signed)
Emergency Medicine Provider Triage Evaluation Note  Karina Prince , a 52 y.o. female  was evaluated in triage.  Pt complains of chest pain. Intermittent pain to L side of chest, pressure, now having pain radiates to L shoulder and neck with mild sob.  No fever, chills, cough, n/v, abd pain  Review of Systems  Positive: As above Negative: As above  Physical Exam  BP (!) 125/94 (BP Location: Left Arm)   Pulse 65   Temp 97.9 F (36.6 C) (Oral)   Resp 18   LMP 06/20/2013   SpO2 100%  Gen:   Awake, no distress   Resp:  Normal effort  MSK:   Moves extremities without difficulty  Other:    Medical Decision Making  Medically screening exam initiated at 2:08 PM.  Appropriate orders placed.  [name] was informed that the remainder of the evaluation will be completed by another provider, this initial triage assessment does not replace that evaluation, and the importance of remaining in the ED until their evaluation is complete.     Fayrene Helper, PA-C 11/08/22 1410

## 2022-11-08 NOTE — ED Provider Notes (Signed)
[place] EMERGENCY DEPARTMENT AT [facility] Provider Note   CSN: [id number] Arrival date & time: 11/08/22  1339     History  Chief complaint: chest pain   Karina Prince is a 52 y.o. female.  Pt with c/o mid to left chest pain, symptoms in past 1-2 days, at rest,  worse w certain movements and positional changes. No associated sob, nv or diaphoresis. No hx cad or fam hx premature cad. No leg pain or swelling. No hx dvt or pe. Occasional non prod cough, no sore throat. No fever. No heartburn. No abd pain or nv. No exertional chest pain or discomfort. No unusual fatigue or doe. No chest wall injury or strain.   The history is provided by the patient and medical records.       Home Medications Prior to Admission medications   Medication Sig Start Date End Date Taking? Authorizing Provider  methocarbamol (ROBAXIN) 750 MG tablet Take 1 tablet (750 mg total) by mouth 3 (three) times daily as needed (muscle spasm/pain). 11/08/22  Yes Cathren Laine, MD  benazepril (LOTENSIN) 5 MG tablet Take 5 mg by mouth daily. 09/14/20   [provider]  BIOTIN PO Take 10,000 mcg by mouth daily.    [provider]  CALCIUM PO Take 1 capsule by mouth daily.    [provider]  clonazePAM (KLONOPIN) 0.5 MG tablet Take 0.5 mg by mouth at bedtime. 01/14/21   [provider]  Cyanocobalamin (B-12 PO) Take by mouth daily. B12 inhalation    [provider]  dicyclomine (BENTYL) 10 MG capsule Take 1 capsule (10 mg total) by mouth once every morning before breakfast. May increase up to 3 times daily before meals and at bedtime as needed for loose stools and abdominal cramping. Hold in the setting of constipation. Patient taking differently: Take 10 mg by mouth See admin instructions. Take 1 capsule (10 mg total) by mouth once every morning before breakfast. May increase up to 3 times daily before meals and at bedtime as needed for loose stools and abdominal  cramping. Hold in the setting of constipation. 11/08/20   Letta Median, PA-C  estradiol (ESTRACE VAGINAL) 0.1 MG/GM vaginal cream Place 0.5 g vaginally at bedtime. Pea-sized amount- up to the first line on the applicator 06/28/21   Myna Hidalgo, DO  gabapentin (NEURONTIN) 300 MG capsule Take 300 mg by mouth at bedtime. 05/25/19   [provider]  methocarbamol (ROBAXIN) 500 MG tablet Take 500 mg by mouth in the morning and at bedtime.    [provider]  Multiple Vitamin (MULTIVITAMIN) tablet Take 1 tablet by mouth daily.    [provider]  traZODone (DESYREL) 100 MG tablet Take 100 mg by mouth at bedtime. 03/18/19   [provider]  venlafaxine (EFFEXOR) 75 MG tablet Take 75 mg by mouth daily. 01/15/19   [provider]      Allergies    Shellfish allergy, Adhesive [tape], Erythromycin, and Feraheme [ferumoxytol]    Review of Systems   Review of Systems  Constitutional:  Negative for chills and fever.  HENT:  Negative for sore throat.   Eyes:  Negative for redness.  Respiratory:  Negative for shortness of breath.   Cardiovascular:  Negative for chest pain.  Gastrointestinal:  Negative for abdominal pain, nausea and vomiting.  Genitourinary:  Negative for flank pain.  Musculoskeletal:  Negative for back pain and neck pain.  Skin:  Negative for rash.  Neurological:  Negative  for headaches.  Hematological:  Does not bruise/bleed easily.  Psychiatric/Behavioral:  Negative for confusion.     Physical Exam Updated Vital Signs BP 124/80 (BP Location: Left Arm)   Pulse (!) 51   Temp 98.2 F (36.8 C) (Oral)   Resp 17   LMP 06/20/2013   SpO2 98%  Physical Exam Vitals and nursing note reviewed.  Constitutional:      Appearance: Normal appearance. She is well-developed.  HENT:     Head: Atraumatic.     Nose: Nose normal.     Mouth/Throat:     Mouth: Mucous membranes are moist.  Eyes:     General: No scleral icterus.     Conjunctiva/sclera: Conjunctivae normal.  Neck:     Trachea: No tracheal deviation.  Cardiovascular:     Rate and Rhythm: Normal rate and regular rhythm.     Pulses: Normal pulses.     Heart sounds: Normal heart sounds. No murmur heard.    No friction rub. No gallop.  Pulmonary:     Effort: Pulmonary effort is normal. No respiratory distress.     Breath sounds: Normal breath sounds.     Comments: Mild chest wall tenderness. No sts. No skin lesions. No crepitus.  Abdominal:     General: Bowel sounds are normal. There is no distension.     Palpations: Abdomen is soft. There is no mass.     Tenderness: There is no abdominal tenderness. There is no guarding.  Musculoskeletal:        General: No swelling or tenderness.     Cervical back: Normal range of motion and neck supple. No rigidity. No muscular tenderness.     Right lower leg: No edema.     Left lower leg: No edema.  Skin:    General: Skin is warm and dry.     Findings: No rash.  Neurological:     Mental Status: She is alert.     Comments: Alert, speech normal.   Psychiatric:        Mood and Affect: Mood normal.     ED Results / Procedures / Treatments   Labs (all labs ordered are listed, but only abnormal results are displayed) Results for orders placed or performed during the hospital encounter of 11/08/22  Basic metabolic panel  Result Value Ref Range   Sodium 138 135 - 145 mmol/L   Potassium 4.0 3.5 - 5.1 mmol/L   Chloride 108 98 - 111 mmol/L   CO2 23 22 - 32 mmol/L   Glucose, Bld 97 70 - 99 mg/dL   BUN 10 6 - 20 mg/dL   Creatinine, Ser 1.610.65 0.44 - 1.00 mg/dL   Calcium 09.610.1 8.9 - 04.510.3 mg/dL   GFR, Estimated >40>60 >98>60 mL/min   Anion gap 7 5 - 15  CBC  Result Value Ref Range   WBC 7.0 4.0 - 10.5 K/uL   RBC 4.70 3.87 - 5.11 MIL/uL   Hemoglobin 14.6 12.0 - 15.0 g/dL   HCT 11.943.3 14.736.0 - 82.946.0 %   MCV 92.1 80.0 - 100.0 fL   MCH 31.1 26.0 - 34.0 pg   MCHC 33.7 30.0 - 36.0 g/dL   RDW 56.212.7 13.011.5 - 86.515.5 %   Platelets  194 150 - 400 K/uL   nRBC 0.0 0.0 - 0.2 %  Troponin I (High Sensitivity)  Result Value Ref Range   Troponin I (High Sensitivity) <2 <18 ng/L  Troponin I (High Sensitivity)  Result Value Ref Range  Troponin I (High Sensitivity) <2 <18 ng/L   DG Chest 1 View  Result Date: 11/08/2022 CLINICAL DATA:  Pain. EXAM: CHEST  1 VIEW COMPARISON:  None Available. FINDINGS: Heart size and mediastinal contours are normal. No pleural fluid or airspace disease. Visualized osseous structures are unremarkable. Cholecystectomy clips noted in the right upper quadrant of the abdomen. IMPRESSION: No acute cardiopulmonary abnormalities. Electronically Signed   By: Signa Kell M.D.   On: 11/08/2022 14:36    EKG EKG Interpretation  Date/Time:  Thursday November 08 2022 13:59:02 EDT Ventricular Rate:  53 PR Interval:  156 QRS Duration: 88 QT Interval:  396 QTC Calculation: 371 R Axis:   1 Text Interpretation: Sinus bradycardia No previous ECGs available Confirmed by Cathren Laine ([id number]) on 11/08/2022 9:17:55 PM  Radiology DG Chest 1 View  Result Date: 11/08/2022 CLINICAL DATA:  Pain. EXAM: CHEST  1 VIEW COMPARISON:  None Available. FINDINGS: Heart size and mediastinal contours are normal. No pleural fluid or airspace disease. Visualized osseous structures are unremarkable. Cholecystectomy clips noted in the right upper quadrant of the abdomen. IMPRESSION: No acute cardiopulmonary abnormalities. Electronically Signed   By: Signa Kell M.D.   On: 11/08/2022 14:36    Procedures Procedures    Medications Ordered in ED Medications  acetaminophen (TYLENOL) tablet 1,000 mg (1,000 mg Oral Given 11/08/22 2232)    ED Course/ Medical Decision Making/ A&P                             Medical Decision Making Problems Addressed: Precordial chest pain: acute illness or injury with systemic symptoms that poses a threat to life or bodily functions  Amount and/or Complexity of Data Reviewed External Data  Reviewed: notes. Labs: ordered. Decision-making details documented in ED Course. Radiology: ordered and independent interpretation performed. Decision-making details documented in ED Course. ECG/medicine tests: ordered and independent interpretation performed. Decision-making details documented in ED Course.  Risk OTC drugs. Prescription drug management. Decision regarding hospitalization.   Iv ns. Continuous pulse ox and cardiac monitoring. Labs ordered/sent. Imaging ordered.   Differential diagnosis includes acs, msk cp, gi cp, etc. Dispo decision including potential need for admission considered - will get labs and imaging and reassess.   Reviewed nursing notes and prior charts for additional history. External reports reviewed.   Cardiac monitor: sinus rhythm, rate 60.  Labs reviewed/interpreted by me - chem normal. Wbc and hgb normal. Trop x 2 normal and not increasing - w symptoms in past day, felt not c/w acs.   Xrays reviewed/interpreted by me - no pna.   Acetaminophen po, ibuprofen po.   Pt currently appears stable for d/c.  Rec pcp/cardiology f/u.  Return precautions provided.            Final Clinical Impression(s) / ED Diagnoses Final diagnoses:  Precordial chest pain    Rx / DC Orders ED Discharge Orders          Ordered    Ambulatory referral to Cardiology       Comments: If you have not heard from the Cardiology office within the next 72 hours please call [phone].   11/08/22 2214    methocarbamol (ROBAXIN) 750 MG tablet  3 times daily PRN        11/08/22 2214              Cathren Laine, MD 11/09/22 [phone]

## 2022-11-15 ENCOUNTER — Encounter: Payer: Self-pay | Admitting: Internal Medicine

## 2022-11-15 ENCOUNTER — Ambulatory Visit: Payer: PRIVATE HEALTH INSURANCE | Attending: Internal Medicine | Admitting: Internal Medicine

## 2022-11-15 VITALS — BP 115/70 | HR 76 | Ht 68.0 in | Wt 198.2 lb

## 2022-11-15 DIAGNOSIS — R0789 Other chest pain: Secondary | ICD-10-CM | POA: Diagnosis not present

## 2022-11-15 DIAGNOSIS — I341 Nonrheumatic mitral (valve) prolapse: Secondary | ICD-10-CM

## 2022-11-15 NOTE — Patient Instructions (Signed)
Medication Instructions:  Your physician recommends that you continue on your current medications as directed. Please refer to the Current Medication list given to you today.  *If you need a refill on your cardiac medications before your next appointment, please call your pharmacy*   Testing/Procedures: Your physician has requested that you have an echocardiogram. Echocardiography is a painless test that uses sound waves to create images of your heart. It provides your doctor with information about the size and shape of your heart and how well your heart's chambers and valves are working. This procedure takes approximately one hour. There are no restrictions for this procedure. Please do NOT wear cologne, perfume, aftershave, or lotions (deodorant is allowed). Please arrive 15 minutes prior to your appointment time.  This procedure will be done at [address]    Follow-Up: At [facility], you and your health needs are our priority.  As part of our continuing mission to provide you with exceptional heart care, we have created designated Provider Care Teams.  These Care Teams include your primary Cardiologist (physician) and Advanced Practice Providers (APPs -  Physician Assistants and Nurse Practitioners) who all work together to provide you with the care you need, when you need it.  We recommend signing up for the patient portal called "MyChart".  Sign up information is provided on this After Visit Summary.  MyChart is used to connect with patients for Virtual Visits (Telemedicine).  Patients are able to view lab/test results, encounter notes, upcoming appointments, etc.  Non-urgent messages can be sent to your provider as well.   To learn more about what you can do with MyChart, go to [email or web address].    Your next appointment:   We will see you on an as needed basis.  Provider:   Mary Branch, MD 

## 2022-11-15 NOTE — Progress Notes (Signed)
Cardiology Office Note:    Date:  11/15/2022   ID:  Karina Prince, DOB [birth year], MRN [id number]  PCP:  Karina Deiters, MD   [facility] Providers Cardiologist:  None     Referring MD: Karina Laine, MD   No chief complaint on file. Atypical CP  History of Present Illness:    Karina Prince is a 52 y.o. female with a hx of asthma, anxiety MVP referral for chest pain  She notes recent viral illness. She had sharp CP.  She notes a bubbling feeling and then it passes. EKG did not show an ischemia. Troponin was negative. She was told she had costochondritis.   Today, she notes "mitral valve pain since childhood"  Notes a general achiness across her chest and radiation down her R arm. Said she was diagnosed with MVP when she was a teenager. Notes she wore a holter monitor. She was on beta blockers in the past. She was told to do vagal maneuvers. No syncope.  Blood pressures is well controlled. No CHF symptoms. Her sister and mother have mitral valve prolapse. TSH is normal.   Past Medical History:  Diagnosis Date   Anemia    Anxiety    Asthma    Diabetes mellitus without complication    Hypertension    Insomnia    MVP (mitral valve prolapse)    Trigeminal neuralgia     Past Surgical History:  Procedure Laterality Date   APPENDECTOMY     BIOPSY  02/02/2021   Procedure: BIOPSY;  Surgeon: Corbin Ade, MD;  Location: [facility];  Service: Endoscopy;;  gastric   CHOLECYSTECTOMY  2005   COLONOSCOPY WITH PROPOFOL N/A 02/02/2021   Surgeon: Corbin Ade, MD;  Entirely normal exam. Repeat in 5 years.   dilitation and curretage     ESOPHAGOGASTRODUODENOSCOPY (EGD) WITH PROPOFOL N/A 02/02/2021   Surgeon: Corbin Ade, MD;Normal esophagus, surgically altered stomach consistent with RYBG.  Abnormal anastomosis most likely benign, possibly related to postsurgical changes s/p biopsy. Pathology with hyperplastic gastric polyp, no H. pylori.   GASTRIC BYPASS   07/2014   TONSILLECTOMY     TUBAL LIGATION     VENTRAL HERNIA REPAIR  02/2015    Current Medications: Current Meds  Medication Sig   benazepril (LOTENSIN) 5 MG tablet Take 5 mg by mouth daily.   BIOTIN PO Take 10,000 mcg by mouth daily.   CALCIUM PO Take 1 capsule by mouth daily.   clonazePAM (KLONOPIN) 0.5 MG tablet Take 0.5 mg by mouth at bedtime.   gabapentin (NEURONTIN) 300 MG capsule Take 300 mg by mouth at bedtime.   methocarbamol (ROBAXIN) 500 MG tablet Take 500 mg by mouth in the morning and at bedtime.   methocarbamol (ROBAXIN) 750 MG tablet Take 1 tablet (750 mg total) by mouth 3 (three) times daily as needed (muscle spasm/pain).   Multiple Vitamin (MULTIVITAMIN) tablet Take 1 tablet by mouth daily.   traZODone (DESYREL) 100 MG tablet Take 100 mg by mouth at bedtime.   venlafaxine (EFFEXOR) 75 MG tablet Take 75 mg by mouth daily.     Allergies:   Shellfish allergy, Adhesive [tape], Erythromycin, and Feraheme [ferumoxytol]   Social History   Socioeconomic History   Marital status: Married    Spouse name: Not on file   Number of children: 4   Years of education: Not on file   Highest education level: Not on file  Occupational History   Not on file  Tobacco Use   Smoking status: Former   Smokeless tobacco: Never  Vaping Use   Vaping Use: Never used  Substance and Sexual Activity   Alcohol use: No   Drug use: No   Sexual activity: Yes    Birth control/protection: Surgical  Other Topics Concern   Not on file  Social History Narrative   Not on file   Social Determinants of Health   Financial Resource Strain: Low Risk  (06/28/2021)   Overall Financial Resource Strain (CARDIA)    Difficulty of Paying Living Expenses: Not very hard  Food Insecurity: No Food Insecurity (06/28/2021)   Hunger Vital Sign    Worried About Running Out of Food in the Last Year: Never true    Ran Out of Food in the Last Year: Never true  Transportation Needs: No Transportation Needs  (06/28/2021)   PRAPARE - Administrator, Civil Service (Medical): No    Lack of Transportation (Non-Medical): No  Physical Activity: Insufficiently Active (06/28/2021)   Exercise Vital Sign    Days of Exercise per Week: 4 days    Minutes of Exercise per Session: 30 min  Stress: Stress Concern Present (06/28/2021)   [facility]ina [facility] [employer] - Occupational Stress Questionnaire    Feeling of Stress : To some extent  Social Connections: Socially Integrated (06/28/2021)   Social Connection and Isolation Panel [NHANES]    Frequency of Communication with Friends and Family: More than three times a week    Frequency of Social Gatherings with Friends and Family: Twice a week    Attends Religious Services: More than 4 times per year    Active Member of [employer] or Organizations: Yes    Attends Engineer, structural: More than 4 times per year    Marital Status: Married     Family History: The patient's family history includes Breast cancer in her maternal aunt; Cataracts in her mother; Colon cancer in her maternal grandmother; Colon polyps in her mother; Hypertension in her brother, daughter, sister, and son; Lung cancer in her maternal grandfather; Migraines in her daughter; Mitral valve prolapse in her mother; Osteoarthritis in her mother; Rheum arthritis in her daughter.  ROS:   Please see the history of present illness.     All other systems reviewed and are negative.  EKGs/Labs/Other Studies Reviewed:    The following studies were reviewed today:   EKG:  EKG is  ordered today.  The ekg ordered today demonstrates  11/15/2022- NSR  Recent Labs: 11/08/2022: BUN 10; Creatinine, Ser 0.65; Hemoglobin 14.6; Platelets 194; Potassium 4.0; Sodium 138   Recent Lipid Panel No results found for: "CHOL", "TRIG", "HDL", "CHOLHDL", "VLDL", "LDLCALC", "LDLDIRECT"   Risk Assessment/Calculations:     Physical Exam:    VS:    BP 115/70   Pulse 76   Ht 5\' 8"   (1.727 m)   Wt 198 lb 3.2 oz (89.9 kg)   LMP 06/20/2013   SpO2 98%   BMI 30.14 kg/m     Wt Readings from Last 3 Encounters:  11/15/22 198 lb 3.2 oz (89.9 kg)  10/21/21 188 lb (85.3 kg)  09/28/21 190 lb (86.2 kg)     GEN:  Well nourished, well developed in no acute distress HEENT: Normal NECK: No JVD; No carotid bruits CARDIAC: RRR, no murmurs, rubs, gallops RESPIRATORY:  Clear to auscultation without rales, wheezing or rhonchi  ABDOMEN: Soft, non-tender, non-distended MUSCULOSKELETAL:  No edema; No deformity  SKIN: Warm and dry NEUROLOGIC:  Alert and oriented x 3 PSYCHIATRIC:  Normal affect   ASSESSMENT:   MVP?: will get a TTE to assess for MVP.  PLAN:    In order of problems listed above:  TTE Follow up pending results      Medication Adjustments/Labs and Tests Ordered: Current medicines are reviewed at length with the patient today.  Concerns regarding medicines are outlined above.  No orders of the defined types were placed in this encounter.  No orders of the defined types were placed in this encounter.   There are no Patient Instructions on file for this visit.   Signed, Maisie Fus, MD  11/15/2022 2:23 PM    [facility]

## 2022-12-11 ENCOUNTER — Ambulatory Visit (HOSPITAL_COMMUNITY): Payer: PRIVATE HEALTH INSURANCE | Attending: Internal Medicine

## 2022-12-11 DIAGNOSIS — R0789 Other chest pain: Secondary | ICD-10-CM | POA: Diagnosis present

## 2022-12-11 DIAGNOSIS — I341 Nonrheumatic mitral (valve) prolapse: Secondary | ICD-10-CM

## 2022-12-11 LAB — ECHOCARDIOGRAM COMPLETE
Area-P 1/2: 2.95 cm2
S' Lateral: 3.4 cm

## 2023-04-25 ENCOUNTER — Encounter (HOSPITAL_COMMUNITY): Payer: Self-pay | Admitting: Hematology

## 2023-04-28 IMAGING — CT CT CERVICAL SPINE W/O CM
3 of 4 series · 12 of 33 positions shown, 14 images · non-contrast
Comparison: None.

CLINICAL DATA: Status post trauma.



[Series 5: sagittal bone · sagittal · 0.25mm/px · 5 of 61 slices shown, 6 images]
[im 21/61  bone]
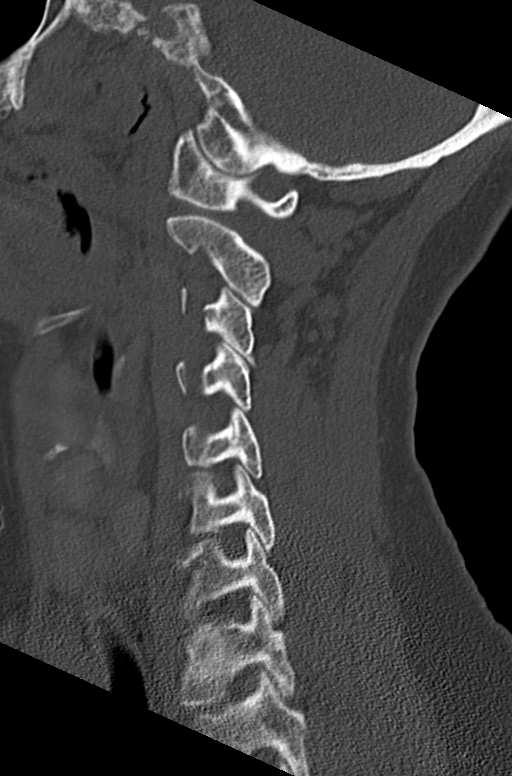
[im 26/61  bone]
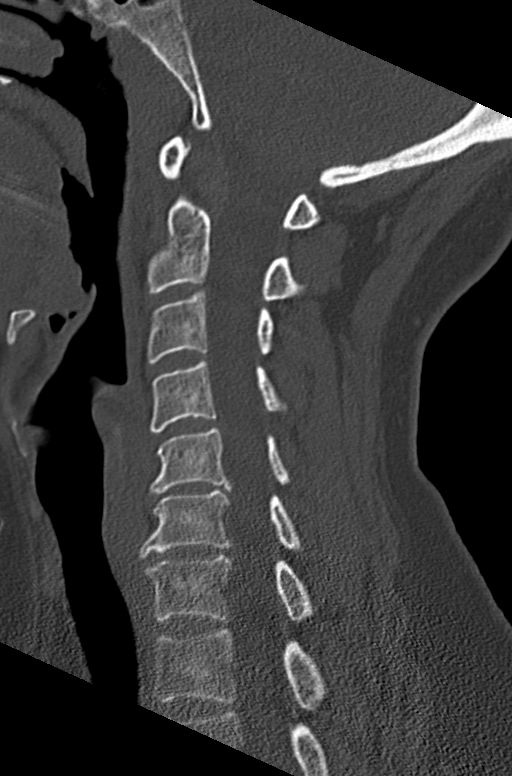
[im 31/61  soft-tissue]
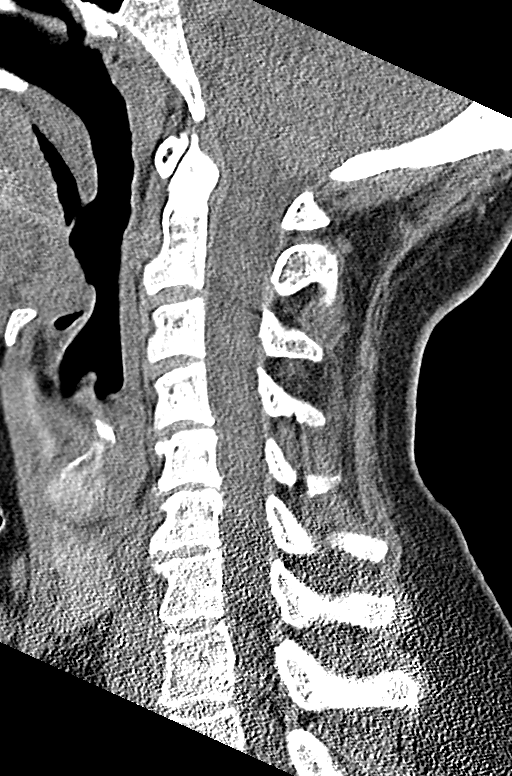
[im 31/61  bone]
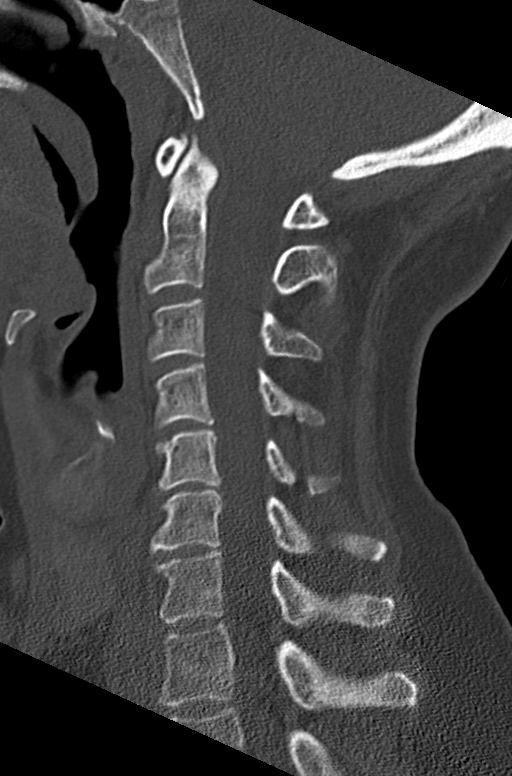
[im 36/61  bone]
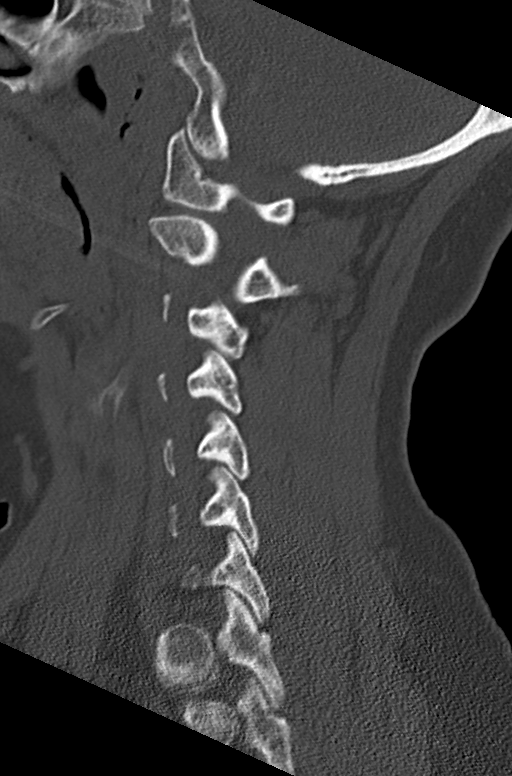
[im 41/61  bone]
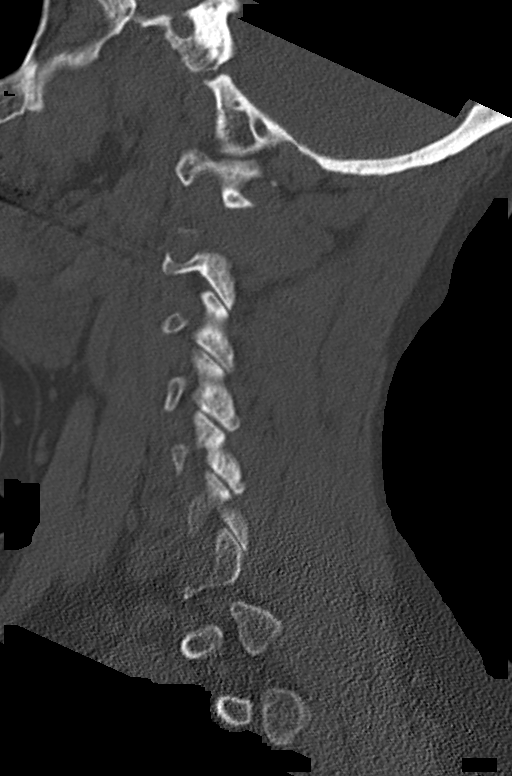

[Series 6: coronal bone · coronal · 0.25mm/px · 3 of 61 slices shown]
[im 15/61  bone]
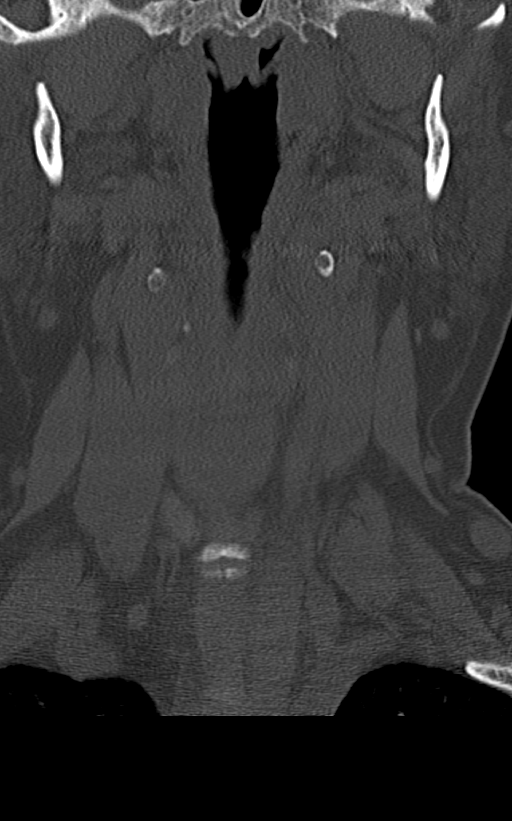
[im 25/61  bone]
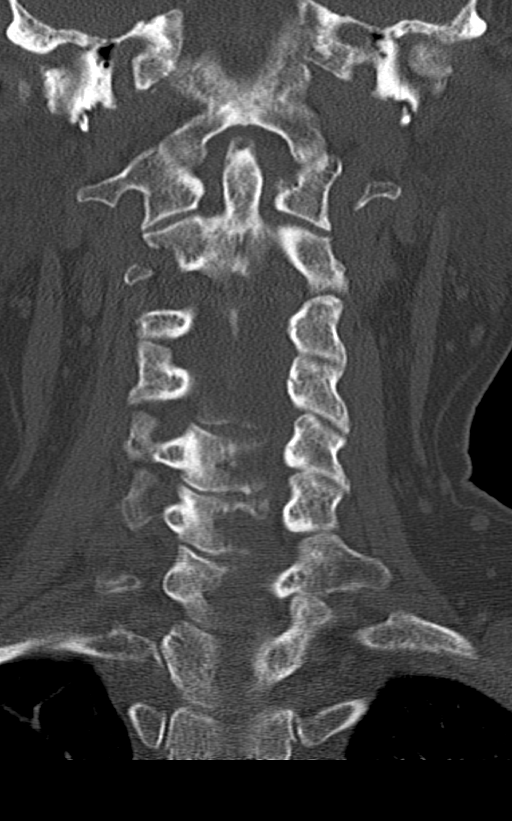
[im 36/61  bone]
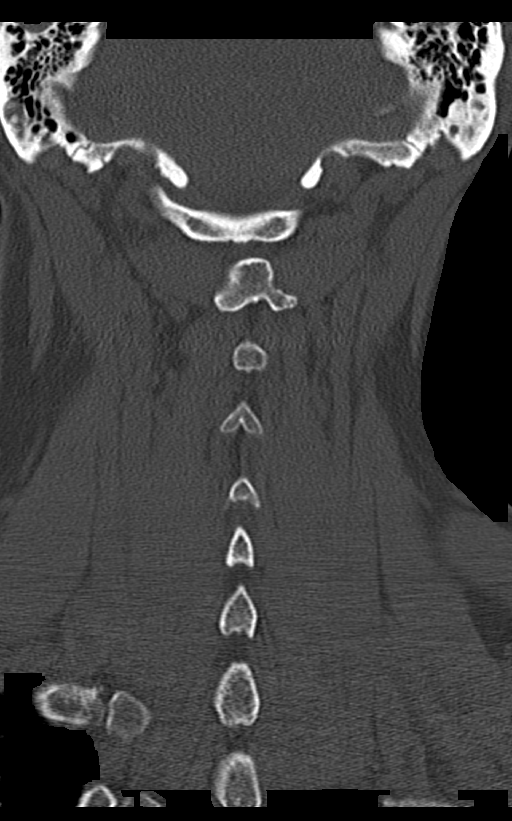

[Series 7: orthogonal axials · axial · 0.21mm/px · z∈[-109,-20]mm · 4 of 79 slices shown, 5 images]
[im 14/79  soft-tissue]
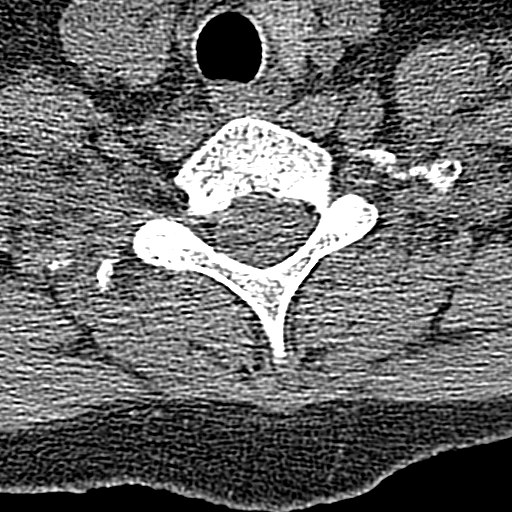
[im 14/79  bone]
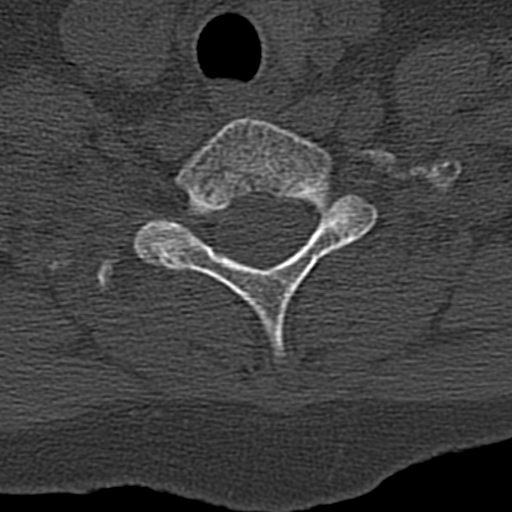
[im 27/79  bone]
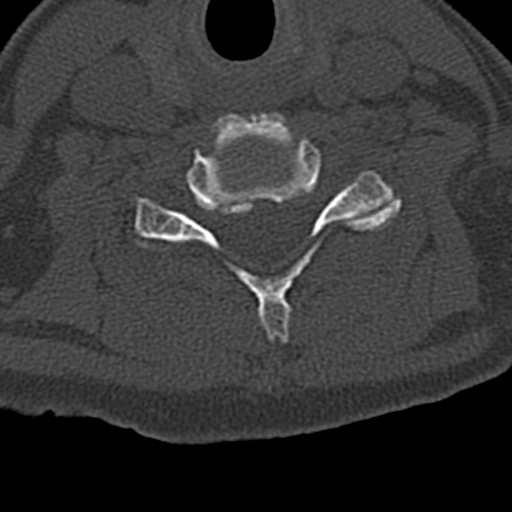
[im 53/79  bone]
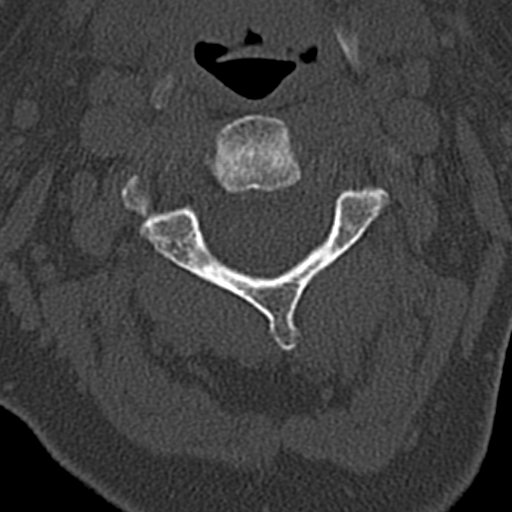
[im 66/79  bone]
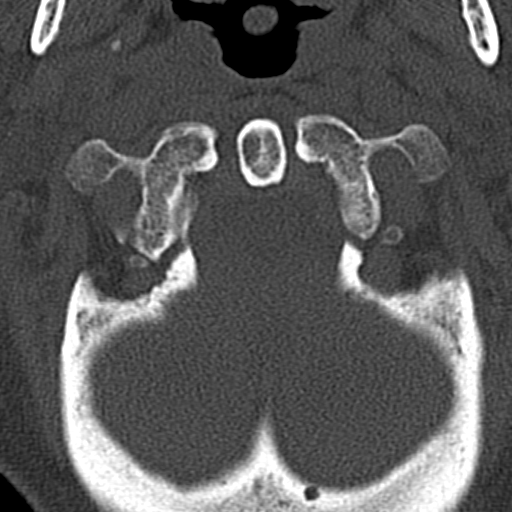

[12 of 33 positions shown; findings below may reference images not displayed]

FINDINGS: Alignment: Normal.

Skull base and vertebrae: No acute fracture. No primary bone lesion
or focal pathologic process.

Soft tissues and spinal canal: No prevertebral fluid or swelling. No
visible canal hematoma.

Disc levels: Mild endplate sclerosis and anterior osteophyte
formation are seen at the levels of C5-C6 and C6-C7.

Mild intervertebral disc space narrowing is seen at C5-C6 and C6-C7.

Mild, bilateral multilevel facet joint hypertrophy is noted.

Upper chest: Negative.

Other: None.
IMPRESSION: 1. No acute fracture or subluxation in the cervical spine.
2. Mild degenerative changes at the levels of C5-C6 and C6-C7.

## 2024-03-12 ENCOUNTER — Other Ambulatory Visit: Payer: Self-pay | Admitting: *Deleted
